# Patient Record
Sex: Female | Born: 1995 | Race: White | Hispanic: No | Marital: Single | Smoking: Never smoker
Health system: Southern US, Community
[De-identification: ages and names within clinical notes are randomized; demographics above are authoritative.]

## PROBLEM LIST (undated history)

## (undated) DIAGNOSIS — Z9889 Other specified postprocedural states: Secondary | ICD-10-CM

## (undated) DIAGNOSIS — Z23 Encounter for immunization: Secondary | ICD-10-CM

## (undated) DIAGNOSIS — R51 Headache: Secondary | ICD-10-CM

## (undated) DIAGNOSIS — N200 Calculus of kidney: Secondary | ICD-10-CM

## (undated) DIAGNOSIS — R519 Headache, unspecified: Secondary | ICD-10-CM

## (undated) DIAGNOSIS — R112 Nausea with vomiting, unspecified: Secondary | ICD-10-CM

## (undated) DIAGNOSIS — Z8041 Family history of malignant neoplasm of ovary: Secondary | ICD-10-CM

## (undated) DIAGNOSIS — Z87442 Personal history of urinary calculi: Secondary | ICD-10-CM

## (undated) DIAGNOSIS — F419 Anxiety disorder, unspecified: Secondary | ICD-10-CM

## (undated) DIAGNOSIS — T8859XA Other complications of anesthesia, initial encounter: Secondary | ICD-10-CM

## (undated) DIAGNOSIS — J40 Bronchitis, not specified as acute or chronic: Secondary | ICD-10-CM

## (undated) HISTORY — DX: Family history of malignant neoplasm of ovary: Z80.41

## (undated) HISTORY — DX: Encounter for immunization: Z23

---
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
## 2017-01-09 DIAGNOSIS — Z8614 Personal history of Methicillin resistant Staphylococcus aureus infection: Secondary | ICD-10-CM

## 2017-01-09 HISTORY — DX: Personal history of Methicillin resistant Staphylococcus aureus infection: Z86.14

## 2017-11-08 ENCOUNTER — Encounter: Payer: Self-pay | Admitting: General Surgery

## 2017-11-08 ENCOUNTER — Ambulatory Visit: Payer: BC Managed Care – PPO | Admitting: General Surgery

## 2017-11-08 VITALS — BP 110/73 | HR 121 | Temp 97.9°F | Resp 16 | Ht 65.0 in | Wt 151.0 lb

## 2017-11-08 DIAGNOSIS — L0591 Pilonidal cyst without abscess: Secondary | ICD-10-CM

## 2017-11-08 NOTE — Patient Instructions (Signed)
Please schedule pilonidal cyst excision surgery.

## 2017-11-08 NOTE — Progress Notes (Signed)
Patient ID: Grace Mendoza, female   DOB: 08/06/1995, 22 y.o.   MRN: [id number]  Chief Complaint  Patient presents with  . Follow-up    Pilonidal cyst -discuss surgery    HPI Grace Mendoza is a 22 y.o. female.  Here today for pilonidal cyst. Seen Pediatrician last month for pain redness and swelling and was prescribed antibiotic, mucopurin topical. She took the full course of medication. However she states last week she started having redness and swelling and pain again.she used the creme that was prescribed last time and it has helped. Denies fever or chills. Complains of nausea last week.  The patient has not had any episodes of spontaneous drainage.  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  History reviewed. No pertinent past medical history.  Past Surgical History:  Procedure Laterality Date  . TONSILLECTOMY  2004    History reviewed. No pertinent family history.  Social History Social History   Tobacco Use  . Smoking status: Never Smoker  . Smokeless tobacco: Never Used  Substance Use Topics  . Alcohol use: No  . Drug use: No    No Known Allergies  Current Outpatient Medications  Medication Sig Dispense Refill  . drospirenone-ethinyl estradiol (YASMIN,ZARAH,SYEDA) 3-0.03 MG tablet TAKE 1 TABLET BY MOUTH ONCE A DAY FOR 28 DAYS  6   No current facility-administered medications for this visit.     Review of Systems Review of Systems  Constitutional: Negative.   Respiratory: Negative.   Skin: Negative.     Blood pressure 110/73, pulse (!) 121, temperature 97.9 F (36.6 C), temperature source Temporal, resp. rate 16, height 5\' 5"  (1.651 m), weight 151 lb (68.5 kg), SpO2 98 %.  Physical Exam Physical Exam  Constitutional: She is oriented to person, place, and time. She appears well-developed and well-nourished.  Cardiovascular: Normal rate and regular rhythm.  Pulmonary/Chest: Effort normal and breath  sounds normal.  Musculoskeletal:       Back:  Neurological: She is alert and oriented to person, place, and time.  Skin: Skin is warm and dry.       Assessment    Recurrent inflammation associated with a pilonidal cyst, minimal active inflammation at this time.    Plan    We will arrange for his pilonidal cyst surgery likely just before Thanksgiving to minimize time off from school.       HPI, Physical Exam, Assessment and Plan have been scribed under the direction and in the presence of Earline Mayotte, MD. Arn Medal, CMA  I have completed the exam and reviewed the above documentation for accuracy and completeness.  I agree with the above.  Museum/gallery conservator has been used and any errors in dictation or transcription are unintentional.  Donnalee Curry, M.D., F.A.C.S.  Merrily Pew Tracy Kinner 11/08/2017, 8:06 PM  Patient's surgery has been scheduled for 12-05-17 at [facility] with Dr. Lemar Livings.  The patient is aware to call the office should they have further questions.   Nicholes Mango, CMA

## 2017-11-29 ENCOUNTER — Other Ambulatory Visit: Payer: Self-pay

## 2017-11-29 ENCOUNTER — Encounter: Payer: Self-pay | Admitting: *Deleted

## 2017-11-29 ENCOUNTER — Encounter
Admission: RE | Admit: 2017-11-29 | Discharge: 2017-11-29 | Disposition: A | Discharge status: Home / Self Care | Payer: BC Managed Care – PPO | Source: Ambulatory Visit | Attending: General Surgery | Admitting: General Surgery

## 2017-11-29 HISTORY — DX: Headache: R51

## 2017-11-29 HISTORY — DX: Headache, unspecified: R51.9

## 2017-11-29 HISTORY — DX: Bronchitis, not specified as acute or chronic: J40

## 2017-11-29 NOTE — Patient Instructions (Signed)
Your procedure is scheduled on: 12-05-17 Report to Same Day Surgery 2nd floor medical mall [facility](Medical Mall Entrance-take elevator on left to 2nd floor.  Check in with surgery information desk.) To find out your arrival time please call [phone] between 1PM - 3PM on 12-04-17  Remember: Instructions that are not followed completely may result in serious medical risk, up to and including death, or upon the discretion of your surgeon and anesthesiologist your surgery may need to be rescheduled.    _x___ 1. Do not eat food after midnight the night before your procedure. You may drink clear liquids up to 2 hours before you are scheduled to arrive at the hospital for your procedure.  Do not drink clear liquids within 2 hours of your scheduled arrival to the hospital.  Clear liquids include  --Water or Apple juice without pulp  --Clear carbohydrate beverage such as ClearFast or Gatorade  --Black Coffee or Clear Tea (No milk, no creamers, do not add anything to the coffee or Tea   ____Ensure clear carbohydrate drink on the way to the hospital for bariatric patients  ____Ensure clear carbohydrate drink 3 hours before surgery for Dr Rutherford NailByrnett's patients if physician instructed.   No gum chewing or hard candies.     __x__ 2. No Alcohol for 24 hours before or after surgery.   __x__3. No Smoking or e-cigarettes for 24 prior to surgery.  Do not use any chewable tobacco products for at least 6 hour prior to surgery   ____  4. Bring all medications with you on the day of surgery if instructed.    __x__ 5. Notify your doctor if there is any change in your medical condition     (cold, fever, infections).    x___6. On the morning of surgery brush your teeth with toothpaste and water.  You may rinse your mouth with mouth wash if you wish.  Do not swallow any toothpaste or mouthwash.   Do not wear jewelry, make-up, hairpins, clips or nail polish.  Do not wear lotions, powders, or perfumes. You may wear  deodorant.  Do not shave 48 hours prior to surgery. Men may shave face and neck.  Do not bring valuables to the hospital.    [facility] is not responsible for any belongings or valuables.               Contacts, dentures or bridgework may not be worn into surgery.  Leave your suitcase in the car. After surgery it may be brought to your room.  For patients admitted to the hospital, discharge time is determined by your treatment team.  _  Patients discharged the day of surgery will not be allowed to drive home.  You will need someone to drive you home and stay with you the night of your procedure.    Please read over the following fact sheets that you were given:   [facility] Preparing for [facility]   ____ Take anti-hypertensive listed below, cardiac, seizure, asthma,     anti-reflux and psychiatric medicines. These include:  1. none  2.  3.  4.  5.  6.  ____Fleets enema or Magnesium Citrate as directed.   ____ Use CHG Soap or sage wipes as directed on instruction sheet   ____ Use inhalers on the day of surgery and bring to hospital day of surgery  ____ Stop Metformin and Janumet 2 days prior to surgery.    ____ Take 1/2 of usual insulin dose the night before  surgery and none on the morning surgery.   ____ Follow recommendations from Cardiologist, Pulmonologist or PCP regarding stopping Aspirin, Coumadin, Plavix ,Eliquis, Effient, or Pradaxa, and Pletal.  ____Stop Anti-inflammatories such as Advil, Aleve, Ibuprofen, Motrin, Naproxen, Naprosyn, Goodies powders or aspirin products NOW-OK to take Tylenol    ____ Stop supplements until after surgery.     ____ Bring C-Pap to the hospital.

## 2017-12-04 MED ORDER — CEFAZOLIN SODIUM-DEXTROSE 2-4 GM/100ML-% IV SOLN
2.0000 g | INTRAVENOUS | Status: AC
  Administered 2017-12-05: 2 g via INTRAVENOUS

## 2017-12-05 ENCOUNTER — Ambulatory Visit
Admission: RE | Admit: 2017-12-05 | Discharge: 2017-12-05 | Disposition: A | Discharge status: Home / Self Care | Payer: BC Managed Care – PPO | Source: Ambulatory Visit | Attending: General Surgery | Admitting: General Surgery

## 2017-12-05 ENCOUNTER — Encounter: Payer: Self-pay | Admitting: Anesthesiology

## 2017-12-05 ENCOUNTER — Encounter
Admission: RE | Disposition: A | Discharge status: Home / Self Care | Payer: Self-pay | Source: Ambulatory Visit | Attending: General Surgery

## 2017-12-05 ENCOUNTER — Ambulatory Visit: Payer: BC Managed Care – PPO | Admitting: Anesthesiology

## 2017-12-05 DIAGNOSIS — Z793 Long term (current) use of hormonal contraceptives: Secondary | ICD-10-CM | POA: Insufficient documentation

## 2017-12-05 DIAGNOSIS — L0591 Pilonidal cyst without abscess: Secondary | ICD-10-CM | POA: Insufficient documentation

## 2017-12-05 HISTORY — PX: PILONIDAL CYST EXCISION: SHX744

## 2017-12-05 LAB — POCT PREGNANCY, URINE: Preg Test, Ur: NEGATIVE

## 2017-12-05 SURGERY — EXCISION, PILONIDAL CYST, EXTENSIVE
Anesthesia: General

## 2017-12-05 MED ORDER — HYDROCODONE-ACETAMINOPHEN 5-325 MG PO TABS: 1.0000 | ORAL_TABLET | ORAL | 0 refills | Status: DC | PRN

## 2017-12-05 MED ORDER — PROMETHAZINE HCL 25 MG/ML IJ SOLN
INTRAMUSCULAR | Status: AC
  Filled 2017-12-05: qty 1

## 2017-12-05 MED ORDER — FAMOTIDINE 20 MG PO TABS
ORAL_TABLET | ORAL | Status: AC
  Filled 2017-12-05: qty 1

## 2017-12-05 MED ORDER — METRONIDAZOLE 500 MG PO TABS: 500.0000 mg | ORAL_TABLET | Freq: Three times a day (TID) | ORAL | 0 refills | Status: AC

## 2017-12-05 MED ORDER — CEFAZOLIN SODIUM-DEXTROSE 2-4 GM/100ML-% IV SOLN
INTRAVENOUS | Status: AC
  Filled 2017-12-05: qty 100

## 2017-12-05 MED ORDER — BUPIVACAINE-EPINEPHRINE (PF) 0.5% -1:200000 IJ SOLN
INTRAMUSCULAR | Status: DC | PRN
  Administered 2017-12-05: 30 mL

## 2017-12-05 MED ORDER — ACETAMINOPHEN 10 MG/ML IV SOLN
INTRAVENOUS | Status: DC | PRN
  Administered 2017-12-05: 1000 mg via INTRAVENOUS

## 2017-12-05 MED ORDER — BUPIVACAINE HCL (PF) 0.5 % IJ SOLN
INTRAMUSCULAR | Status: AC
  Filled 2017-12-05: qty 30

## 2017-12-05 MED ORDER — MIDAZOLAM HCL 2 MG/2ML IJ SOLN
INTRAMUSCULAR | Status: DC | PRN
  Administered 2017-12-05: 2 mg via INTRAVENOUS

## 2017-12-05 MED ORDER — FENTANYL CITRATE (PF) 100 MCG/2ML IJ SOLN
INTRAMUSCULAR | Status: DC | PRN
  Administered 2017-12-05 (×2): 50 ug via INTRAVENOUS

## 2017-12-05 MED ORDER — PROPOFOL 10 MG/ML IV BOLUS
INTRAVENOUS | Status: DC | PRN
  Administered 2017-12-05: 150 mg via INTRAVENOUS

## 2017-12-05 MED ORDER — PROMETHAZINE HCL 25 MG/ML IJ SOLN: 12.5000 mg | INTRAMUSCULAR | Status: DC | PRN

## 2017-12-05 MED ORDER — HYDROCODONE-ACETAMINOPHEN 5-325 MG PO TABS
ORAL_TABLET | ORAL | Status: AC
  Filled 2017-12-05: qty 1

## 2017-12-05 MED ORDER — ONDANSETRON HCL 4 MG/2ML IJ SOLN
INTRAMUSCULAR | Status: AC
  Filled 2017-12-05: qty 2

## 2017-12-05 MED ORDER — FENTANYL CITRATE (PF) 100 MCG/2ML IJ SOLN: 25.0000 ug | INTRAMUSCULAR | Status: DC | PRN

## 2017-12-05 MED ORDER — ACETAMINOPHEN 10 MG/ML IV SOLN
INTRAVENOUS | Status: AC
  Filled 2017-12-05: qty 100

## 2017-12-05 MED ORDER — ROCURONIUM BROMIDE 100 MG/10ML IV SOLN
INTRAVENOUS | Status: DC | PRN
  Administered 2017-12-05: 30 mg via INTRAVENOUS

## 2017-12-05 MED ORDER — PROMETHAZINE HCL 25 MG/ML IJ SOLN
12.5000 mg | Freq: Once | INTRAMUSCULAR | Status: AC
  Administered 2017-12-05: 12.5 mg via INTRAVENOUS

## 2017-12-05 MED ORDER — HYDROCODONE-ACETAMINOPHEN 5-325 MG PO TABS
1.0000 | ORAL_TABLET | ORAL | Status: DC | PRN
  Administered 2017-12-05: 1 via ORAL

## 2017-12-05 MED ORDER — SUGAMMADEX SODIUM 200 MG/2ML IV SOLN
INTRAVENOUS | Status: AC
  Filled 2017-12-05: qty 2

## 2017-12-05 MED ORDER — LACTATED RINGERS IV SOLN
INTRAVENOUS | Status: DC
  Administered 2017-12-05: 12:00:00 via INTRAVENOUS

## 2017-12-05 MED ORDER — DEXAMETHASONE SODIUM PHOSPHATE 10 MG/ML IJ SOLN
INTRAMUSCULAR | Status: DC | PRN
  Administered 2017-12-05: 10 mg via INTRAVENOUS

## 2017-12-05 MED ORDER — ONDANSETRON HCL 4 MG/2ML IJ SOLN: 4.0000 mg | Freq: Once | INTRAMUSCULAR | Status: DC | PRN

## 2017-12-05 MED ORDER — DEXAMETHASONE SODIUM PHOSPHATE 10 MG/ML IJ SOLN
INTRAMUSCULAR | Status: AC
  Filled 2017-12-05: qty 1

## 2017-12-05 MED ORDER — FENTANYL CITRATE (PF) 100 MCG/2ML IJ SOLN
INTRAMUSCULAR | Status: AC
  Filled 2017-12-05: qty 2

## 2017-12-05 MED ORDER — SUGAMMADEX SODIUM 200 MG/2ML IV SOLN
INTRAVENOUS | Status: DC | PRN
  Administered 2017-12-05: 150 mg via INTRAVENOUS

## 2017-12-05 MED ORDER — ONDANSETRON HCL 4 MG/2ML IJ SOLN
INTRAMUSCULAR | Status: DC | PRN
  Administered 2017-12-05: 4 mg via INTRAVENOUS

## 2017-12-05 MED ORDER — EPINEPHRINE PF 1 MG/ML IJ SOLN
INTRAMUSCULAR | Status: AC
  Filled 2017-12-05: qty 1

## 2017-12-05 MED ORDER — MIDAZOLAM HCL 2 MG/2ML IJ SOLN
INTRAMUSCULAR | Status: AC
  Filled 2017-12-05: qty 2

## 2017-12-05 MED ORDER — LIDOCAINE HCL (CARDIAC) PF 100 MG/5ML IV SOSY
PREFILLED_SYRINGE | INTRAVENOUS | Status: DC | PRN
  Administered 2017-12-05: 100 mg via INTRAVENOUS

## 2017-12-05 MED ORDER — SODIUM CHLORIDE FLUSH 0.9 % IV SOLN
INTRAVENOUS | Status: AC
  Filled 2017-12-05: qty 10

## 2017-12-05 MED ORDER — FAMOTIDINE 20 MG PO TABS
20.0000 mg | ORAL_TABLET | Freq: Once | ORAL | Status: AC
  Administered 2017-12-05: 20 mg via ORAL

## 2017-12-05 SURGICAL SUPPLY — 35 items
BLADE SURG 11 STRL SS SAFETY (MISCELLANEOUS) ×3 IMPLANT
BLADE SURG 15 STRL SS SAFETY (BLADE) ×3 IMPLANT
CANISTER SUCT 1200ML W/VALVE (MISCELLANEOUS) ×3 IMPLANT
CLOSURE WOUND 1/2 X4 (GAUZE/BANDAGES/DRESSINGS) ×1
COVER WAND RF STERILE (DRAPES) IMPLANT
DRAPE LAPAROTOMY 100X77 ABD (DRAPES) ×3 IMPLANT
DRSG OPSITE POSTOP 3X4 (GAUZE/BANDAGES/DRESSINGS) ×3 IMPLANT
DRSG TEGADERM 4X4.75 (GAUZE/BANDAGES/DRESSINGS) IMPLANT
DRSG TELFA 3X8 NADH (GAUZE/BANDAGES/DRESSINGS) IMPLANT
ELECT CAUTERY BLADE TIP 2.5 (TIP) ×3
ELECT REM PT RETURN 9FT ADLT (ELECTROSURGICAL) ×3
ELECTRODE CAUTERY BLDE TIP 2.5 (TIP) ×1 IMPLANT
ELECTRODE REM PT RTRN 9FT ADLT (ELECTROSURGICAL) ×1 IMPLANT
GLOVE BIO SURGEON STRL SZ7.5 (GLOVE) ×6 IMPLANT
GLOVE INDICATOR 8.0 STRL GRN (GLOVE) ×6 IMPLANT
GOWN STRL REUS W/ TWL LRG LVL3 (GOWN DISPOSABLE) ×2 IMPLANT
GOWN STRL REUS W/TWL LRG LVL3 (GOWN DISPOSABLE) ×4
KIT TURNOVER KIT A (KITS) ×3 IMPLANT
LABEL OR SOLS (LABEL) ×3 IMPLANT
NEEDLE HYPO 22GX1.5 SAFETY (NEEDLE) ×3 IMPLANT
NEEDLE HYPO 25X1 1.5 SAFETY (NEEDLE) ×3 IMPLANT
NS IRRIG 500ML POUR BTL (IV SOLUTION) ×3 IMPLANT
PACK BASIN MINOR ARMC (MISCELLANEOUS) ×3 IMPLANT
SOL PREP PVP 2OZ (MISCELLANEOUS) ×3
SOLUTION PREP PVP 2OZ (MISCELLANEOUS) ×1 IMPLANT
STRIP CLOSURE SKIN 1/2X4 (GAUZE/BANDAGES/DRESSINGS) ×2 IMPLANT
SUT PROLENE 4-0 (SUTURE) ×2
SUT PROLENE 4-0 RB1 30XMFL BLU (SUTURE) ×1
SUT VIC AB 2-0 CT1 (SUTURE) ×3 IMPLANT
SUT VIC AB 2-0 CT2 27 (SUTURE) ×3 IMPLANT
SUT VICRYL 3-0 27IN (SUTURE) ×3 IMPLANT
SUT VICRYL+ 3-0 144IN (SUTURE) ×3 IMPLANT
SUTURE PROLEN 4-0 RB1 30XMFL (SUTURE) ×1 IMPLANT
SWABSTK COMLB BENZOIN TINCTURE (MISCELLANEOUS) ×3 IMPLANT
SYR 10ML LL (SYRINGE) ×3 IMPLANT

## 2017-12-05 NOTE — OR Nursing (Signed)
Per Dr. Lemar LivingsByrnett tele [phone], ok to discharge to home without his visit.

## 2017-12-05 NOTE — H&P (Signed)
No change in clinical history or exam. For pilonidal cyst excision.

## 2017-12-05 NOTE — Anesthesia Procedure Notes (Signed)
Procedure Name: Intubation Date/Time: 12/05/2017 12:16 PM Performed by: Aline Brochure, CRNA Pre-anesthesia Checklist: Patient identified, Emergency Drugs available, Suction available and Patient being monitored Patient Re-evaluated:Patient Re-evaluated prior to induction Oxygen Delivery Method: Circle system utilized Preoxygenation: Pre-oxygenation with 100% oxygen Induction Type: IV induction Ventilation: Mask ventilation without difficulty Laryngoscope Size: Mac and 3 Grade View: Grade II Tube type: Oral Tube size: 7.0 mm Number of attempts: 1 Airway Equipment and Method: Stylet Placement Confirmation: ETT inserted through vocal cords under direct vision,  positive ETCO2 and breath sounds checked- equal and bilateral Secured at: 21 cm Tube secured with: Tape Dental Injury: Teeth and Oropharynx as per pre-operative assessment

## 2017-12-05 NOTE — OR Nursing (Signed)
C/o "I feel dizzy and weak" after dressed and to wheelchair and IV d/c'd.  Assisted back to recliner, approx 10 minutes later, "I feel fine now".  VS obtained, given to Dr Karlton LemonKarenz, notified him pt taking po's well, advises ok to discharge to home.

## 2017-12-05 NOTE — Anesthesia Post-op Follow-up Note (Signed)
Anesthesia QCDR form completed.        

## 2017-12-05 NOTE — Anesthesia Preprocedure Evaluation (Signed)
Anesthesia Evaluation  Patient identified by MRN, date of birth, ID band Patient awake    Reviewed: Allergy & Precautions, H&P , NPO status , Patient's Chart, lab work & pertinent test results, reviewed documented beta blocker date and time   History of Anesthesia Complications Negative for: history of anesthetic complications  Airway Mallampati: II  TM Distance: >3 FB Neck ROM: full    Dental  (+) Dental Advidsory Given, Teeth Intact   Pulmonary neg pulmonary ROS,           Cardiovascular Exercise Tolerance: Good negative cardio ROS       Neuro/Psych  Headaches, neg Seizures negative psych ROS   GI/Hepatic negative GI ROS, Neg liver ROS,   Endo/Other  negative endocrine ROS  Renal/GU negative Renal ROS  negative genitourinary   Musculoskeletal   Abdominal   Peds  Hematology negative hematology ROS (+)   Anesthesia Other Findings Past Medical History: No date: Bronchitis No date: Headache     Comment:  migraines   Reproductive/Obstetrics negative OB ROS                             Anesthesia Physical Anesthesia Plan  ASA: I  Anesthesia Plan: General   Post-op Pain Management:    Induction: Intravenous  PONV Risk Score and Plan: 3 and Ondansetron, Dexamethasone, Midazolam, Promethazine and Treatment may vary due to age or medical condition  Airway Management Planned: Oral ETT  Additional Equipment:   Intra-op Plan:   Post-operative Plan: Extubation in OR  Informed Consent: I have reviewed the patients History and Physical, chart, labs and discussed the procedure including the risks, benefits and alternatives for the proposed anesthesia with the patient or authorized representative who has indicated his/her understanding and acceptance.   Dental Advisory Given  Plan Discussed with: Anesthesiologist, CRNA and Surgeon  Anesthesia Plan Comments:         Anesthesia  Quick Evaluation

## 2017-12-05 NOTE — Discharge Instructions (Signed)

## 2017-12-05 NOTE — Anesthesia Preprocedure Evaluation (Deleted)
Anesthesia Evaluation  Patient identified by MRN, date of birth, ID band Patient awake    Reviewed: Allergy & Precautions, NPO status , Patient's Chart, lab work & pertinent test results, reviewed documented beta blocker date and time   Airway Mallampati: II  TM Distance: >3 FB     Dental  (+) Chipped   Pulmonary           Cardiovascular      Neuro/Psych  Headaches,    GI/Hepatic   Endo/Other    Renal/GU      Musculoskeletal   Abdominal   Peds  Hematology   Anesthesia Other Findings   Reproductive/Obstetrics                             Anesthesia Physical Anesthesia Plan  ASA: II  Anesthesia Plan: General   Post-op Pain Management:    Induction: Intravenous  PONV Risk Score and Plan:   Airway Management Planned: Oral ETT  Additional Equipment:   Intra-op Plan:   Post-operative Plan:   Informed Consent: I have reviewed the patients History and Physical, chart, labs and discussed the procedure including the risks, benefits and alternatives for the proposed anesthesia with the patient or authorized representative who has indicated his/her understanding and acceptance.     Plan Discussed with: CRNA  Anesthesia Plan Comments:         Anesthesia Quick Evaluation

## 2017-12-05 NOTE — Op Note (Signed)
Preoperative diagnosis: Pilonidal cyst.  Postoperative diagnosis: Same.  Operative procedure: Excision of pilonidal cyst.  Operating Surgeon: Donnalee CurryJeffrey Ayeisha Lindenberger, MD.  Anesthesia: General endotracheal, Marcaine 0.5% with 1 to 200,000 units of epinephrine, 30 cc.  Estimated blood loss: 10 cc.  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  She was felt to be a candidate for excision.  She received Ancef prior to the procedure.  Operative note: The patient was given the opportunity for local anesthesia or general anesthesia and chose the latter.  She underwent general anesthesia was rolled prone.  The buttocks were taped apart and local anesthesia was infiltrated.  Initially 3 diamond-shaped incisions were used to remove each of the pores.  This was converted into a single 3 cm opening to provide better exposure.  Chronic inflammatory tissue was removed.  Hemostasis was electrocautery.  The adipose tissue was elevated off the gluteus fascia and interrupted 2-0 Vicryl figure-of-eight sutures used to obliterate the deep dead space.  A similar layer was used in the superficial fat.  The skin was closed with interrupted 3-0 Vicryl subcuticular sutures.  Benzoin and Steri-Strip followed by a honeycomb dressing was applied.  The patient tolerated the procedure well was taken recovery room in stable condition.

## 2017-12-05 NOTE — OR Nursing (Signed)
Little bloody sputum with cough when up to bathroom.  Dr. Karlton LemonKarenz in to see patient postop 1453.

## 2017-12-05 NOTE — Transfer of Care (Signed)
Immediate Anesthesia Transfer of Care Note  Patient: Grace Mendoza  Procedure(s) Performed: CYST EXCISION PILONIDAL EXTENSIVE (N/A )  Patient Location: PACU  Anesthesia Type:General  Level of Consciousness: sedated  Airway & Oxygen Therapy: Patient connected to face mask oxygen  Post-op Assessment: Post -op Vital signs reviewed and stable  Post vital signs: stable  Last Vitals:  Vitals Value Taken Time  BP 93/46 12/05/2017  1:03 PM  Temp 36.4 C 12/05/2017  1:03 PM  Pulse 82 12/05/2017  1:03 PM  Resp 16 12/05/2017  1:03 PM  SpO2 99 % 12/05/2017  1:03 PM  Vitals shown include unvalidated device data.  Last Pain:  Vitals:   12/05/17 1112  TempSrc: Oral  PainSc: 0-No pain         Complications: No apparent anesthesia complications

## 2017-12-10 LAB — SURGICAL PATHOLOGY

## 2017-12-10 NOTE — Anesthesia Postprocedure Evaluation (Signed)
Anesthesia Post Note  Patient: Grace SpiceLindsay Anne Parthasarathy  Procedure(s) Performed: CYST EXCISION PILONIDAL EXTENSIVE (N/A )  Patient location during evaluation: PACU Anesthesia Type: General Level of consciousness: awake and alert Pain management: pain level controlled Vital Signs Assessment: post-procedure vital signs reviewed and stable Respiratory status: spontaneous breathing, nonlabored ventilation, respiratory function stable and patient connected to nasal cannula oxygen Cardiovascular status: blood pressure returned to baseline and stable Postop Assessment: no apparent nausea or vomiting Anesthetic complications: no     Last Vitals:  Vitals:   12/05/17 1454 12/05/17 1520  BP: (!) 100/51 (!) 94/43  Pulse: 86 81  Resp: 16 16  Temp:    SpO2: 100% 99%    Last Pain:  Vitals:   12/05/17 1520  TempSrc:   PainSc: 1                  Lenard SimmerAndrew Bianney [place]wood

## 2017-12-17 ENCOUNTER — Ambulatory Visit (INDEPENDENT_AMBULATORY_CARE_PROVIDER_SITE_OTHER): Payer: BC Managed Care – PPO

## 2017-12-17 VITALS — BP 110/73 | HR 121 | Temp 97.9°F | Resp 16 | Ht 65.0 in | Wt 151.0 lb

## 2017-12-17 DIAGNOSIS — L0591 Pilonidal cyst without abscess: Secondary | ICD-10-CM

## 2017-12-17 NOTE — Progress Notes (Signed)
Patient ID: [name], female   DOB: 11/26/1995, 22 y.o.   MRN: [id number] Patient came in today for a wound check.  The wound is clean, with no signs of infection noted. Follow up as scheduled.

## 2017-12-27 ENCOUNTER — Other Ambulatory Visit: Payer: Self-pay

## 2017-12-27 ENCOUNTER — Encounter: Payer: Self-pay | Admitting: General Surgery

## 2017-12-27 ENCOUNTER — Ambulatory Visit (INDEPENDENT_AMBULATORY_CARE_PROVIDER_SITE_OTHER): Payer: BC Managed Care – PPO | Admitting: General Surgery

## 2017-12-27 VITALS — BP 127/82 | HR 112 | Resp 12 | Ht 64.0 in | Wt 147.0 lb

## 2017-12-27 DIAGNOSIS — L0591 Pilonidal cyst without abscess: Secondary | ICD-10-CM

## 2017-12-27 NOTE — Patient Instructions (Addendum)
The patient is aware to call back for any questions or new concerns. Follow up as needed or if wound does not heal or there are concerns.

## 2017-12-27 NOTE — Progress Notes (Signed)
Patient ID: Grace Mendoza, female   DOB: 06/30/1995, 22 y.o.   MRN: [id number]  Chief Complaint  Patient presents with  . Routine Post Op    HPI Grace Mendoza is a 22 y.o. female.   s/p pilonidal cyst excision done 12-05-17. She states she is doing well, no further pain medication needed. She does notice a small amount of blood occasionally.  HPI  Past Medical History:  Diagnosis Date  . Bronchitis   . Headache    migraines    Past Surgical History:  Procedure Laterality Date  . PILONIDAL CYST EXCISION N/A 12/05/2017   Procedure: CYST EXCISION PILONIDAL EXTENSIVE;  Surgeon: Earline MayotteByrnett, Shashana Fullington W, MD;  Location: [facility] OR[facility];  Service: General;  Laterality: N/A;  . TONSILLECTOMY  2004    No family history on file.  Social History Social History   Tobacco Use  . Smoking status: Never Smoker  . Smokeless tobacco: Never Used  Substance Use Topics  . Alcohol use: No  . Drug use: No    No Known Allergies  Current Outpatient Medications  Medication Sig Dispense Refill  . diphenhydrAMINE (BENADRYL) 25 MG tablet Take 25 mg by mouth every 6 (six) hours as needed.    . drospirenone-ethinyl estradiol (YASMIN,ZARAH,SYEDA) 3-0.03 MG tablet Take 1 tablet by mouth daily.   6  . SUMAtriptan (IMITREX) 5 MG/ACT nasal spray Place 1 spray into the nose every 2 (two) hours as needed for migraine.     No current facility-administered medications for this visit.     Review of Systems Review of Systems  Constitutional: Negative.   Respiratory: Negative.   Cardiovascular: Negative.   Gastrointestinal: Negative for constipation and diarrhea.    Blood pressure 127/82, pulse (!) 112, resp. rate 12, height 5\' 4"  (1.626 m), weight 147 lb (66.7 kg), last menstrual period 12/17/2017, SpO2 98 %.  Physical Exam Physical Exam Musculoskeletal:       Back:  Skin:    General: Skin is warm and dry.     Comments: Silver nitrate application  Neurological:     Mental Status: She is alert and  oriented to person, place, and time.  Psychiatric:        Mood and Affect: Mood normal.     Data Reviewed DIAGNOSIS:  A. PILONIDAL CYST; EXCISION:  - RUPTURED PILONIDAL CYST.   Assessment    Doing well post pilonidal cyst excision.    Plan    Follow up as needed or if wound does not heal or there are concerns. The patient is aware to call back for any questions or new concerns.      HPI, Physical Exam, Assessment and Plan have been scribed under the direction and in the presence of Earline MayotteJeffrey W. Rosaisela Jamroz, MD. Dorathy DaftMarsha Hatch, RN  I have completed the exam and reviewed the above documentation for accuracy and completeness.  I agree with the above.  Museum[employer]gallery [employer]agon [employer] has been used and any errors in dictation or transcription are unintentional.  Donnalee CurryJeffrey Kyrin Garn, M.D., F.A.C.S.  [name] 12/28/2017, 7:27 AM

## 2018-12-02 ENCOUNTER — Other Ambulatory Visit: Payer: Self-pay

## 2018-12-02 DIAGNOSIS — Z20822 Contact with and (suspected) exposure to covid-19: Secondary | ICD-10-CM

## 2018-12-04 LAB — NOVEL CORONAVIRUS, NAA: SARS-CoV-2, NAA: NOT DETECTED

## 2018-12-19 ENCOUNTER — Other Ambulatory Visit: Payer: Self-pay

## 2018-12-19 DIAGNOSIS — Z20822 Contact with and (suspected) exposure to covid-19: Secondary | ICD-10-CM

## 2018-12-21 LAB — NOVEL CORONAVIRUS, NAA: SARS-CoV-2, NAA: NOT DETECTED

## 2019-01-10 HISTORY — PX: WISDOM TOOTH EXTRACTION: SHX21

## 2019-01-22 ENCOUNTER — Ambulatory Visit: Payer: BC Managed Care – PPO | Attending: Internal Medicine

## 2019-01-22 DIAGNOSIS — Z20822 Contact with and (suspected) exposure to covid-19: Secondary | ICD-10-CM

## 2019-01-23 LAB — NOVEL CORONAVIRUS, NAA: SARS-CoV-2, NAA: NOT DETECTED

## 2019-02-14 ENCOUNTER — Ambulatory Visit: Payer: BC Managed Care – PPO | Admitting: Obstetrics and Gynecology

## 2019-04-11 ENCOUNTER — Ambulatory Visit: Payer: BC Managed Care – PPO | Admitting: Obstetrics and Gynecology

## 2019-04-14 ENCOUNTER — Ambulatory Visit (INDEPENDENT_AMBULATORY_CARE_PROVIDER_SITE_OTHER): Payer: BC Managed Care – PPO | Admitting: Obstetrics and Gynecology

## 2019-04-14 ENCOUNTER — Encounter: Payer: Self-pay | Admitting: Obstetrics and Gynecology

## 2019-04-14 ENCOUNTER — Other Ambulatory Visit: Payer: Self-pay

## 2019-04-14 ENCOUNTER — Other Ambulatory Visit (HOSPITAL_COMMUNITY)
Admission: RE | Admit: 2019-04-14 | Discharge: 2019-04-14 | Disposition: A | Discharge status: Home / Self Care | Payer: BC Managed Care – PPO | Source: Ambulatory Visit | Attending: Obstetrics and Gynecology | Admitting: Obstetrics and Gynecology

## 2019-04-14 VITALS — BP 110/60 | Ht 64.0 in | Wt 161.0 lb

## 2019-04-14 DIAGNOSIS — Z113 Encounter for screening for infections with a predominantly sexual mode of transmission: Secondary | ICD-10-CM | POA: Diagnosis not present

## 2019-04-14 DIAGNOSIS — Z3041 Encounter for surveillance of contraceptive pills: Secondary | ICD-10-CM | POA: Diagnosis not present

## 2019-04-14 DIAGNOSIS — Z124 Encounter for screening for malignant neoplasm of cervix: Secondary | ICD-10-CM | POA: Insufficient documentation

## 2019-04-14 DIAGNOSIS — Z0000 Encounter for general adult medical examination without abnormal findings: Secondary | ICD-10-CM

## 2019-04-14 DIAGNOSIS — Z01419 Encounter for gynecological examination (general) (routine) without abnormal findings: Secondary | ICD-10-CM | POA: Diagnosis not present

## 2019-04-14 MED ORDER — NIKKI 3-0.02 MG PO TABS: 1.0000 | ORAL_TABLET | Freq: Every day | ORAL | 11 refills | Status: DC

## 2019-04-14 NOTE — Progress Notes (Signed)
Gynecology Annual Exam  PCP: Gregary Signs, MD  Chief Complaint:  Chief Complaint  Grace Mendoza presents with  . Gynecologic Exam    no concerns    History of Present Illness: Grace Mendoza is a 24 y.o. G0P0000 presents for annual exam. The Grace Mendoza has no complaints today.   LMP: Grace Mendoza's last menstrual period was 04/11/2019 (approximate). Menarche:13 or 14 Average Interval: regular, 28 days Duration of flow: 4 days Heavy Menses: no Clots: no Intermenstrual Bleeding: no Postcoital Bleeding: no Dysmenorrhea: no  The Grace Mendoza is sexually active. She currently uses OCP (estrogen/progesterone) for contraception. She denies dyspareunia.  The Grace Mendoza does perform self breast exams.  There is no notable family history of breast or ovarian cancer in her family.  The Grace Mendoza has regular exercise: yes.    The Grace Mendoza denies current symptoms of depression.    Review of Systems: ROS  Past Medical History:  Grace Mendoza Active Problem List   Diagnosis Date Noted  . Pilonidal cyst 07/20/2016    Past Surgical History:  Past Surgical History:  Procedure Laterality Date  . PILONIDAL CYST EXCISION N/A 12/05/2017   Procedure: CYST EXCISION PILONIDAL EXTENSIVE;  Surgeon: Robert Bellow, MD;  Location: [facility] OR[facility];  Service: General;  Laterality: N/A;  . TONSILLECTOMY  2004    Gynecologic History:  Grace Mendoza's last menstrual period was 04/11/2019 (approximate). Contraception: OCP (estrogen/progesterone) Last Pap: Results were: no history   Obstetric History: G0P0000  Family History:  History reviewed. No pertinent family history.  Social History:  Social History   Socioeconomic History  . Marital status: Single    Spouse name: Not on file  . Number of children: Not on file  . Years of education: Not on file  . Highest education level: Not on file  Occupational History  . Not on file  Tobacco Use  . Smoking status: Never Smoker  . Smokeless tobacco: Never Used  Substance and  Sexual Activity  . Alcohol use: No  . Drug use: No  . Sexual activity: Yes    Birth control/protection: Pill  Other Topics Concern  . Not on file  Social History Narrative  . Not on file   Social Determinants of Health   Financial Resource Strain:   . Difficulty of Paying Living Expenses:   Food Insecurity:   . Worried About Charity fundraiser in the Last Year:   . Arboriculturist in the Last Year:   Transportation Needs:   . Film/video editor (Medical):   Marland Kitchen Lack of Transportation (Non-Medical):   Physical Activity:   . Days of Exercise per Week:   . Minutes of Exercise per Session:   Stress:   . Feeling of Stress :   Social Connections:   . Frequency of Communication with Friends and Family:   . Frequency of Social Gatherings with Friends and Family:   . Attends Religious Services:   . Active Member of Clubs or Organizations:   . Attends Archivist Meetings:   Marland Kitchen Marital Status:   Intimate Partner Violence:   . Fear of Current or Ex-Partner:   . Emotionally Abused:   Marland Kitchen Physically Abused:   . Sexually Abused:     Allergies:  No Known Allergies  Medications: Prior to Admission medications   Medication Sig Start Date End Date Taking? Authorizing Provider  diphenhydrAMINE (BENADRYL) 25 MG tablet Take 25 mg by mouth every 6 (six) hours as needed.   Yes [provider]  NIKKI 3-0.02  MG tablet Take 1 tablet by mouth daily. 01/09/19  Yes [provider]    Physical Exam Vitals: Blood pressure 110/60, height 5\' 4"  (1.626 m), weight 161 lb (73 kg), last menstrual period 04/11/2019.  General: NAD HEENT: normocephalic, anicteric Thyroid: no enlargement, no palpable nodules Pulmonary: No increased work of breathing, CTAB Cardiovascular: RRR, distal pulses 2+ Breast: Breast symmetrical, no tenderness, no palpable nodules or masses, no skin or nipple retraction present, no nipple discharge.  No axillary or supraclavicular  lymphadenopathy. Abdomen: NABS, soft, non-tender, non-distended.  Umbilicus without lesions.  No hepatomegaly, splenomegaly or masses palpable. No evidence of hernia  Genitourinary:  External: Normal external female genitalia.  Normal urethral meatus, normal Bartholin's and Skene's glands.    Vagina: Normal vaginal mucosa, no evidence of prolapse.    Cervix: Grossly normal in appearance, no bleeding  Uterus: Non-enlarged, mobile, normal contour.  No CMT  Adnexa: ovaries non-enlarged, no adnexal masses  Rectal: deferred  Lymphatic: no evidence of inguinal lymphadenopathy Extremities: no edema, erythema, or tenderness Neurologic: Grossly intact Psychiatric: mood appropriate, affect full  Female chaperone present for pelvic and breast  portions of the physical exam    Assessment: 24 y.o. G0P0000 routine annual exam  Plan: Problem List Items Addressed This Visit    None    Visit Diagnoses    Healthcare maintenance    -  Primary   Encounter for birth control pills maintenance       Relevant Medications   NIKKI 3-0.02 MG tablet   Screening examination for STD (sexually transmitted disease)       Relevant Orders   Cytology - PAP   Cervical cancer screening       Relevant Orders   Cytology - PAP      1) Gardasil Series discussed and if applicable offered to Grace Mendoza - Grace Mendoza has previously completed 3 shot series   2) STI screening  wasoffered and accepted  3)  ASCCP guidelines and rational discussed.  Grace Mendoza opts for every 3 years screening interval  4) Contraception - the Grace Mendoza is currently using  OCP (estrogen/progesterone).  She is happy with her current form of contraception and plans to continue We discussed safe sex practices to reduce her furture risk of STI's.    5) No follow-ups on file.  01-27-1994 MD [facility], [facility] 04/14/2019 8:52 AM

## 2019-04-14 NOTE — Patient Instructions (Signed)
Institute of Medicine Recommended Dietary Allowances for Calcium and Vitamin D  Age (yr) Calcium Recommended Dietary Allowance (mg/day) Vitamin D Recommended Dietary Allowance (international units/day)  9-18 1,300 600  19-50 1,000 600  51-70 1,200 600  71 and older 1,200 800  Data from Institute of Medicine. Dietary reference intakes: calcium, vitamin D. [place], [place]: National Academies Press; 2011.     Exercising to Stay Healthy To become healthy and stay healthy, it is recommended that you do moderate-intensity and vigorous-intensity exercise. You can tell that you are exercising at a moderate intensity if your heart starts beating faster and you start breathing faster but can still hold a conversation. You can tell that you are exercising at a vigorous intensity if you are breathing much harder and faster and cannot hold a conversation while exercising. Exercising regularly is important. It has many health benefits, such as:  Improving overall fitness, flexibility, and endurance.  Increasing bone density.  Helping with weight control.  Decreasing body fat.  Increasing muscle strength.  Reducing stress and tension.  Improving overall health. How often should I exercise? Choose an activity that you enjoy, and set realistic goals. Your health care provider can help you make an activity plan that works for you. Exercise regularly as told by your health care provider. This may include:  Doing strength training two times a week, such as: ? Lifting weights. ? Using resistance bands. ? Push-ups. ? Sit-ups. ? Yoga.  Doing a certain intensity of exercise for a given amount of time. Choose from these options: ? A total of 150 minutes of moderate-intensity exercise every week. ? A total of 75 minutes of vigorous-intensity exercise every week. ? A mix of moderate-intensity and vigorous-intensity exercise every week. Children, pregnant women, people who have not exercised  regularly, people who are overweight, and older adults may need to talk with a health care provider about what activities are safe to do. If you have a medical condition, be sure to talk with your health care provider before you start a new exercise program. What are some exercise ideas? Moderate-intensity exercise ideas include:  Walking 1 mile (1.6 km) in about 15 minutes.  Biking.  Hiking.  Golfing.  Dancing.  Water aerobics. Vigorous-intensity exercise ideas include:  Walking 4.5 miles (7.2 km) or more in about 1 hour.  Jogging or running 5 miles (8 km) in about 1 hour.  Biking 10 miles (16.1 km) or more in about 1 hour.  Lap swimming.  Roller-skating or in-line skating.  Cross-country skiing.  Vigorous competitive sports, such as football, basketball, and soccer.  Jumping rope.  Aerobic dancing. What are some everyday activities that can help me to get exercise?  Yard work, such as: ? Pushing a lawn mower. ? Raking and bagging leaves.  Washing your car.  Pushing a stroller.  Shoveling snow.  Gardening.  Washing windows or floors. How can I be more active in my day-to-day activities?  Use stairs instead of an elevator.  Take a walk during your lunch break.  If you drive, park your car farther away from your work or school.  If you take public transportation, get off one stop early and walk the rest of the way.  Stand up or walk around during all of your indoor phone calls.  Get up, stretch, and walk around every 30 minutes throughout the day.  Enjoy exercise with a friend. Support to continue exercising will help you keep a regular routine of activity. What guidelines can   I follow while exercising?  Before you start a new exercise program, talk with your health care provider.  Do not exercise so much that you hurt yourself, feel dizzy, or get very short of breath.  Wear comfortable clothes and wear shoes with good support.  Drink plenty of  water while you exercise to prevent dehydration or heat stroke.  Work out until your breathing and your heartbeat get faster. Where to find more information  U.S[place] Department of Health and Human Services: www.hhs.gov  Centers for Disease Control and Prevention (CDC): www.cdc.gov Summary  Exercising regularly is important. It will improve your overall fitness, flexibility, and endurance.  Regular exercise also will improve your overall health. It can help you control your weight, reduce stress, and improve your bone density.  Do not exercise so much that you hurt yourself, feel dizzy, or get very short of breath.  Before you start a new exercise program, talk with your health care provider. This information is not intended to replace advice given to you by your health care provider. Make sure you discuss any questions you have with your health care provider. Document Revised: 12/08/2016 Document Reviewed: 11/16/2016 Elsevier Patient Education  2020 Elsevier Inc.   Budget-Friendly Healthy Eating There are many ways to save money at the grocery store and continue to eat healthy. You can be successful if you:  Plan meals according to your budget.  Make a grocery list and only purchase food according to your grocery list.  Prepare food yourself. What are tips for following this plan?  Reading food labels  Compare food labels between brand name foods and the store brand. Often the nutritional value is the same, but the store brand is lower cost.  Look for products that do not have added sugar, fat, or salt (sodium). These often cost the same but are healthier for you. Products may be labeled as: ? Sugar-free. ? Nonfat. ? Low-fat. ? Sodium-free. ? Low-sodium.  Look for lean ground beef labeled as at least 92% lean and 8% fat. Shopping  Buy only the items on your grocery list and go only to the areas of the store that have the items on your list.  Use coupons only for foods  and brands you normally buy. Avoid buying items you wouldn't normally buy simply because they are on sale.  Check online and in newspapers for weekly deals.  Buy healthy items from the bulk bins when available, such as herbs, spices, flour, pasta, nuts, and dried fruit.  Buy fruits and vegetables that are in season. Prices are usually lower on in-season produce.  Look at the unit price on the price tag. Use it to compare different brands and sizes to find out which item is the best deal.  Choose healthy items that are often low-cost, such as carrots, potatoes, apples, bananas, and oranges. Dried or canned beans are a low-cost protein source.  Buy in bulk and freeze extra food. Items you can buy in bulk include meats, fish, poultry, frozen fruits, and frozen vegetables.  Avoid buying "ready-to-eat" foods, such as pre-cut fruits and vegetables and pre-made salads.  If possible, shop around to discover where you can find the best prices. Consider other retailers such as dollar stores, larger wholesale stores, local fruit and vegetable stands, and farmers markets.  Do not shop when you are hungry. If you shop while hungry, it may be hard to stick to your list and budget.  Resist impulse buying. Use your grocery list as   your official plan for the week.  Buy a variety of vegetables and fruits by purchasing fresh, frozen, and canned items.  Look at the top and bottom shelves for deals. Foods at eye level (eye level of an adult or child) are usually more expensive.  Be efficient with your time when shopping. The more time you spend at the store, the more money you are likely to spend.  To save money when choosing more expensive foods like meats and dairy: ? Choose cheaper cuts of meat, such as bone-in chicken thighs and drumsticks instead of skinless and boneless chicken. When you are ready to prepare the chicken, you can remove the skin yourself to make it healthier. ? Choose lean meats like  chicken or turkey instead of beef. ? Choose canned seafood, such as tuna, salmon, or sardines. ? Buy eggs as a low-cost source of protein. ? Buy dried beans and peas, such as lentils, split peas, or kidney beans instead of meats. Dried beans and peas are a good alternative source of protein. ? Buy the larger tubs of yogurt instead of individual-sized containers.  Choose water instead of sodas and other sweetened beverages.  Avoid buying chips, cookies, and other "junk food." These items are usually expensive and not healthy. Cooking  Make extra food and freeze the extras in meal-sized containers or in individual portions for fast meals and snacks.  Pre-cook on days when you have extra time to prepare meals in advance. You can keep these meals in the fridge or freezer and reheat for a quick meal.  When you come home from the grocery store, wash, peel, and cut fruits and vegetables so they are ready to use and eat. This will help reduce food waste. Meal planning  Do not eat out or get fast food. Prepare food at home.  Make a grocery list and make sure to bring it with you to the store. If you have a smart phone, you could use your phone to create your shopping list.  Plan meals and snacks according to a grocery list and budget you create.  Use leftovers in your meal plan for the week.  Look for recipes where you can cook once and make enough food for two meals.  Include budget-friendly meals like stews, casseroles, and stir-fry dishes.  Try some meatless meals or try "no cook" meals like salads.  Make sure that half your plate is filled with fruits or vegetables. Choose from fresh, frozen, or canned fruits and vegetables. If eating canned, remember to rinse them before eating. This will remove any excess salt added for packaging. Summary  Eating healthy on a budget is possible if you plan your meals according to your budget, purchase according to your budget and grocery list, and  prepare food yourself.  Tips for buying more food on a limited budget include buying generic brands, using coupons only for foods you normally buy, and buying healthy items from the bulk bins when available.  Tips for buying cheaper food to replace expensive food include choosing cheaper, lean cuts of meat, and buying dried beans and peas. This information is not intended to replace advice given to you by your health care provider. Make sure you discuss any questions you have with your health care provider. Document Revised: 12/27/2016 Document Reviewed: 12/27/2016 Elsevier Patient Education  2020 Elsevier Inc.   Bone Health Bones protect organs, store calcium, anchor muscles, and support the whole body. Keeping your bones strong is important, especially as you   get older. You can take actions to help keep your bones strong and healthy. Why is keeping my bones healthy important?  Keeping your bones healthy is important because your body constantly replaces bone cells. Cells get old, and new cells take their place. As we age, we lose bone cells because the body may not be able to make enough new cells to replace the old cells. The amount of bone cells and bone tissue you have is referred to as bone mass. The higher your bone mass, the stronger your bones. The aging process leads to an overall loss of bone mass in the body, which can increase the likelihood of:  Joint pain and stiffness.  Broken bones.  A condition in which the bones become weak and brittle (osteoporosis). A large decline in bone mass occurs in older adults. In women, it occurs about the time of menopause. What actions can I take to keep my bones healthy? Good health habits are important for maintaining healthy bones. This includes eating nutritious foods and exercising regularly. To have healthy bones, you need to get enough of the right minerals and vitamins. Most nutrition experts recommend getting these nutrients from the  foods that you eat. In some cases, taking supplements may also be recommended. Doing certain types of exercise is also important for bone health. What are the nutritional recommendations for healthy bones?  Eating a well-balanced diet with plenty of calcium and vitamin D will help to protect your bones. Nutritional recommendations vary from person to person. Ask your health care provider what is healthy for you. Here are some general guidelines. Get enough calcium Calcium is the most important (essential) mineral for bone health. Most people can get enough calcium from their diet, but supplements may be recommended for people who are at risk for osteoporosis. Good sources of calcium include:  Dairy products, such as low-fat or nonfat milk, cheese, and yogurt.  Dark green leafy vegetables, such as bok choy and broccoli.  Calcium-fortified foods, such as orange juice, cereal, bread, soy beverages, and tofu products.  Nuts, such as almonds. Follow these recommended amounts for daily calcium intake:  Children, age 1-3: 700 mg.  Children, age 4-8: 1,000 mg.  Children, age 9-13: 1,300 mg.  Teens, age 14-18: 1,300 mg.  Adults, age 19-50: 1,000 mg.  Adults, age 51-70: ? Men: 1,000 mg. ? Women: 1,200 mg.  Adults, age 71 or older: 1,200 mg.  Pregnant and breastfeeding females: ? Teens: 1,300 mg. ? Adults: 1,000 mg. Get enough vitamin D Vitamin D is the most essential vitamin for bone health. It helps the body absorb calcium. Sunlight stimulates the skin to make vitamin D, so be sure to get enough sunlight. If you live in a cold climate or you do not get outside often, your health care provider may recommend that you take vitamin D supplements. Good sources of vitamin D in your diet include:  Egg yolks.  Saltwater fish.  Milk and cereal fortified with vitamin D. Follow these recommended amounts for daily vitamin D intake:  Children and teens, age 1-18: 600 international  units.  Adults, age 50 or younger: 400-800 international units.  Adults, age 51 or older: 800-1,000 international units. Get other important nutrients Other nutrients that are important for bone health include:  Phosphorus. This mineral is found in meat, poultry, dairy foods, nuts, and legumes. The recommended daily intake for adult men and adult women is 700 mg.  Magnesium. This mineral is found in seeds, nuts, dark   green vegetables, and legumes. The recommended daily intake for adult men is 400-420 mg. For adult women, it is 310-320 mg.  Vitamin K. This vitamin is found in green leafy vegetables. The recommended daily intake is 120 mg for adult men and 90 mg for adult women. What type of physical activity is best for building and maintaining healthy bones? Weight-bearing and strength-building activities are important for building and maintaining healthy bones. Weight-bearing activities cause muscles and bones to work against gravity. Strength-building activities increase the strength of the muscles that support bones. Weight-bearing and muscle-building activities include:  Walking and hiking.  Jogging and running.  Dancing.  Gym exercises.  Lifting weights.  Tennis and racquetball.  Climbing stairs.  Aerobics. Adults should get at least 30 minutes of moderate physical activity on most days. Children should get at least 60 minutes of moderate physical activity on most days. Ask your health care provider what type of exercise is best for you. How can I find out if my bone mass is low? Bone mass can be measured with an X-ray test called a bone mineral density (BMD) test. This test is recommended for all women who are age 65 or older. It may also be recommended for:  Men who are age 70 or older.  People who are at risk for osteoporosis because of: ? Having bones that break easily. ? Having a [place]-term disease that weakens bones, such as kidney disease or rheumatoid  arthritis. ? Having menopause earlier than normal. ? Taking medicine that weakens bones, such as steroids, thyroid hormones, or hormone treatment for breast cancer or prostate cancer. ? Smoking. ? Drinking three or more alcoholic drinks a day. If you find that you have a low bone mass, you may be able to prevent osteoporosis or further bone loss by changing your diet and lifestyle. Where can I find more information? For more information, check out the following websites:  National Osteoporosis Foundation: www.nof.org/patients  National Institutes of Health: www.bones.nih.gov  International Osteoporosis Foundation: www.iofbonehealth.org Summary  The aging process leads to an overall loss of bone mass in the body, which can increase the likelihood of broken bones and osteoporosis.  Eating a well-balanced diet with plenty of calcium and vitamin D will help to protect your bones.  Weight-bearing and strength-building activities are also important for building and maintaining strong bones.  Bone mass can be measured with an X-ray test called a bone mineral density (BMD) test. This information is not intended to replace advice given to you by your health care provider. Make sure you discuss any questions you have with your health care provider. Document Revised: 01/22/2017 Document Reviewed: 01/22/2017 Elsevier Patient Education  2020 Elsevier Inc.   

## 2019-04-17 LAB — CYTOLOGY - PAP
Chlamydia: NEGATIVE
Comment: NEGATIVE
Comment: NEGATIVE
Comment: NORMAL
Diagnosis: NEGATIVE
Neisseria Gonorrhea: NEGATIVE
Trichomonas: NEGATIVE

## 2020-03-12 ENCOUNTER — Other Ambulatory Visit: Payer: Self-pay | Admitting: Obstetrics and Gynecology

## 2020-03-12 DIAGNOSIS — Z3041 Encounter for surveillance of contraceptive pills: Secondary | ICD-10-CM

## 2020-04-30 ENCOUNTER — Encounter: Payer: Self-pay | Admitting: Obstetrics and Gynecology

## 2020-04-30 ENCOUNTER — Other Ambulatory Visit: Payer: Self-pay

## 2020-04-30 ENCOUNTER — Ambulatory Visit (INDEPENDENT_AMBULATORY_CARE_PROVIDER_SITE_OTHER): Payer: BC Managed Care – PPO | Admitting: Obstetrics and Gynecology

## 2020-04-30 VITALS — BP 128/72 | Ht 64.0 in | Wt 175.2 lb

## 2020-04-30 DIAGNOSIS — Z0000 Encounter for general adult medical examination without abnormal findings: Secondary | ICD-10-CM | POA: Diagnosis not present

## 2020-04-30 DIAGNOSIS — Z01419 Encounter for gynecological examination (general) (routine) without abnormal findings: Secondary | ICD-10-CM

## 2020-04-30 DIAGNOSIS — Z3041 Encounter for surveillance of contraceptive pills: Secondary | ICD-10-CM

## 2020-04-30 DIAGNOSIS — Z1239 Encounter for other screening for malignant neoplasm of breast: Secondary | ICD-10-CM

## 2020-04-30 MED ORDER — NIKKI 3-0.02 MG PO TABS: 1.0000 | ORAL_TABLET | Freq: Every day | ORAL | 3 refills | Status: DC

## 2020-04-30 NOTE — Progress Notes (Signed)
Gynecology Annual Exam  PCP: Erick Colace, MD  Chief Complaint:  Chief Complaint  Patient presents with  . Gynecologic Exam    History of Present Illness: Patient is a 25 y.o. G0P0000 presents for annual exam. The patient has no complaints today.   LMP: Patient's last menstrual period was 04/05/2020. Average Interval: monthly Duration of flow: 3 days Heavy Menses: Day 2 is heavy, but managable Dysmenorrhea: no  She denies passage of large clots She denies sensations of gushing or flooding of blood. She denies accidents where she bleeds through her clothing. She denies that she changes a saturated pad or tampon more frequently than every hour.  She denies that pain from her periods limits her activities.  The patient does perform self breast exams.  There is notable family history of breast or ovarian cancer in her family.- Paternal Aunt diagnosed with Ovarian cacner  The patient reports her exercise generally consists of walking 1-2 days a week .  The patient denies current symptoms of depression.   Review of Systems: ROS  Past Medical History:  Past Medical History:  Diagnosis Date  . Bronchitis   . Headache    migraines    Past Surgical History:  Past Surgical History:  Procedure Laterality Date  . PILONIDAL CYST EXCISION N/A 12/05/2017   Procedure: CYST EXCISION PILONIDAL EXTENSIVE;  Surgeon: Earline Mayotte, MD;  Location: [facility] ORS;  Service: General;  Laterality: N/A;  . TONSILLECTOMY  2004    Gynecologic History:  Patient's last menstrual period was 04/05/2020. Menarche: 13  History of fibroids, polyps, or ovarian cysts? : no  History of PCOS? no Hstory of Endometriosis? no History of abnormal pap smears? no Have you had any sexually transmitted infections in the past? no  She reports HPV vaccination in the past.   Last Pap: Results were: 2021 NIL    She identifies as a female. She is sexually active with men.   She denies dyspareunia.  She denies postcoital bleeding.  She currently uses OCP (estrogen/progesterone) for contraception.    Obstetric History: G0P0000  Family History:  History reviewed. No pertinent family history.  Social History:  Social History   Socioeconomic History  . Marital status: Single    Spouse name: Not on file  . Number of children: Not on file  . Years of education: Not on file  . Highest education level: Not on file  Occupational History  . Not on file  Tobacco Use  . Smoking status: Never Smoker  . Smokeless tobacco: Never Used  Vaping Use  . Vaping Use: Never used  Substance and Sexual Activity  . Alcohol use: No  . Drug use: No  . Sexual activity: Yes    Birth control/protection: Pill  Other Topics Concern  . Not on file  Social History Narrative  . Not on file   Social Determinants of Health   Financial Resource Strain: Not on file  Food Insecurity: Not on file  Transportation Needs: Not on file  Physical Activity: Not on file  Stress: Not on file  Social Connections: Not on file  Intimate Partner Violence: Not on file    Allergies:  No Known Allergies  Medications: Prior to Admission medications   Medication Sig Start Date End Date Taking? Authorizing Provider  diphenhydrAMINE (BENADRYL) 25 MG tablet Take 25 mg by mouth every 6 (six) hours as needed.   Yes [provider]  NIKKI 3-0.02 MG tablet TAKE 1 TABLET BY MOUTH EVERY DAY  03/12/20  Yes Nashayla Telleria, Jaquelyn Bitter, MD    Physical Exam Vitals: Blood pressure 128/72, height 5\' 4"  (1.626 m), weight 175 lb 3.2 oz (79.5 kg), last menstrual period 04/05/2020.  Physical Exam Constitutional:      Appearance: She is well-developed.  Genitourinary:     Genitourinary Comments: External: Normal appearing vulva. No lesions noted.  Bimanual examination: Uterus midline, non-tender, normal in size, shape and contour.  No CMT. No adnexal masses. No adnexal tenderness. Pelvis not fixed.  Breast Exam: breast equal  without skin changes, nipple discharge, breast lump or enlarged lymph nodes   HENT:     Head: Normocephalic and atraumatic.  Neck:     Thyroid: No thyromegaly.  Cardiovascular:     Rate and Rhythm: Normal rate and regular rhythm.     Heart sounds: Normal heart sounds.  Pulmonary:     Effort: Pulmonary effort is normal.     Breath sounds: Normal breath sounds.  Abdominal:     General: Bowel sounds are normal. There is no distension.     Palpations: Abdomen is soft. There is no mass.  Musculoskeletal:     Cervical back: Neck supple.  Neurological:     Mental Status: She is alert and oriented to person, place, and time.  Skin:    General: Skin is warm and dry.  Psychiatric:        Behavior: Behavior normal.        Thought Content: Thought content normal.        Judgment: Judgment normal.  Vitals reviewed.      Female chaperone present for pelvic and breast  portions of the physical exam  Assessment: 25 y.o. G0P0000 routine annual exam  Plan: Problem List Items Addressed This Visit   None   Visit Diagnoses    Encounter for annual routine gynecological examination    -  Primary   Health maintenance examination       Encounter for birth control pills maintenance       Relevant Medications   NIKKI 3-0.02 MG tablet   Encounter for gynecological examination without abnormal finding       Encounter for screening breast examination          1) STI screening was offered and declined  2) ASCCP guidelines and rational discussed.  Patient opts for every 3 years screening interval  3) Contraception - continue OCP  4) Routine healthcare maintenance including cholesterol, diabetes screening discussed managed by PCP   01-[birth year]1996 MD, Adel[facility], [facility] 04/30/2020 4:28 PM

## 2020-04-30 NOTE — Patient Instructions (Signed)
Institute of Medicine Recommended Dietary Allowances for Calcium and Vitamin D  Age (yr) Calcium Recommended Dietary Allowance (mg/day) Vitamin D Recommended Dietary Allowance (international units/day)  9-18 1,300 600  19-50 1,000 600  51-70 1,200 600  71 and older 1,200 800  Data from Institute of Medicine. Dietary reference intakes: calcium, vitamin D. [place], [place]: National Academies Press; 2011.    Exercising to Stay Healthy To become healthy and stay healthy, it is recommended that you do moderate-intensity and vigorous-intensity exercise. You can tell that you are exercising at a moderate intensity if your heart starts beating faster and you start breathing faster but can still hold a conversation. You can tell that you are exercising at a vigorous intensity if you are breathing much harder and faster and cannot hold a conversation while exercising. Exercising regularly is important. It has many health benefits, such as:  Improving overall fitness, flexibility, and endurance.  Increasing bone density.  Helping with weight control.  Decreasing body fat.  Increasing muscle strength.  Reducing stress and tension.  Improving overall health. How often should I exercise? Choose an activity that you enjoy, and set realistic goals. Your health care provider can help you make an activity plan that works for you. Exercise regularly as told by your health care provider. This may include:  Doing strength training two times a week, such as: ? Lifting weights. ? Using resistance bands. ? Push-ups. ? Sit-ups. ? Yoga.  Doing a certain intensity of exercise for a given amount of time. Choose from these options: ? A total of 150 minutes of moderate-intensity exercise every week. ? A total of 75 minutes of vigorous-intensity exercise every week. ? A mix of moderate-intensity and vigorous-intensity exercise every week. Children, pregnant women, people who have not exercised  regularly, people who are overweight, and older adults may need to talk with a health care provider about what activities are safe to do. If you have a medical condition, be sure to talk with your health care provider before you start a new exercise program. What are some exercise ideas? Moderate-intensity exercise ideas include:  Walking 1 mile (1.6 km) in about 15 minutes.  Biking.  Hiking.  Golfing.  Dancing.  Water aerobics. Vigorous-intensity exercise ideas include:  Walking 4.5 miles (7.2 km) or more in about 1 hour.  Jogging or running 5 miles (8 km) in about 1 hour.  Biking 10 miles (16.1 km) or more in about 1 hour.  Lap swimming.  Roller-skating or in-line skating.  Cross-country skiing.  Vigorous competitive sports, such as football, basketball, and soccer.  Jumping rope.  Aerobic dancing.   What are some everyday activities that can help me to get exercise?  Yard work, such as: ? Pushing a lawn mower. ? Raking and bagging leaves.  Washing your car.  Pushing a stroller.  Shoveling snow.  Gardening.  Washing windows or floors. How can I be more active in my day-to-day activities?  Use stairs instead of an elevator.  Take a walk during your lunch break.  If you drive, park your car farther away from your work or school.  If you take public transportation, get off one stop early and walk the rest of the way.  Stand up or walk around during all of your indoor phone calls.  Get up, stretch, and walk around every 30 minutes throughout the day.  Enjoy exercise with a friend. Support to continue exercising will help you keep a regular routine of activity. What guidelines   can I follow while exercising?  Before you start a new exercise program, talk with your health care provider.  Do not exercise so much that you hurt yourself, feel dizzy, or get very short of breath.  Wear comfortable clothes and wear shoes with good support.  Drink plenty of  water while you exercise to prevent dehydration or heat stroke.  Work out until your breathing and your heartbeat get faster. Where to find more information  U.S[place] Department of Health and Human Services: www.hhs.gov  Centers for Disease Control and Prevention (CDC): www.cdc.gov Summary  Exercising regularly is important. It will improve your overall fitness, flexibility, and endurance.  Regular exercise also will improve your overall health. It can help you control your weight, reduce stress, and improve your bone density.  Do not exercise so much that you hurt yourself, feel dizzy, or get very short of breath.  Before you start a new exercise program, talk with your health care provider. This information is not intended to replace advice given to you by your health care provider. Make sure you discuss any questions you have with your health care provider. Document Revised: 12/08/2016 Document Reviewed: 11/16/2016 Elsevier Patient Education  2021 Elsevier Inc.   Budget-Friendly Healthy Eating There are many ways to save money at the grocery store and continue to eat healthy. You can be successful if you:  Plan meals according to your budget.  Make a grocery list and only purchase food according to your grocery list.  Prepare food yourself at home. What are tips for following this plan? Reading food labels  Compare food labels between brand name foods and the store brand. Often the nutritional value is the same, but the store brand is lower cost.  Look for products that do not have added sugar, fat, or salt (sodium). These often cost the same but are healthier for you. Products may be labeled as: ? Sugar-free. ? Nonfat. ? Low-fat. ? Sodium-free. ? Low-sodium.  Look for lean ground beef labeled as at least 92% lean and 8% fat. Shopping  Buy only the items on your grocery list and go only to the areas of the store that have the items on your list.  Use coupons only for  foods and brands you normally buy. Avoid buying items you wouldn't normally buy simply because they are on sale.  Check online and in newspapers for weekly deals.  Buy healthy items from the bulk bins when available, such as herbs, spices, flour, pasta, nuts, and dried fruit.  Buy fruits and vegetables that are in season. Prices are usually lower on in-season produce.  Look at the unit price on the price tag. Use it to compare different brands and sizes to find out which item is the best deal.  Choose healthy items that are often low-cost, such as carrots, potatoes, apples, bananas, and oranges. Dried or canned beans are a low-cost protein source.  Buy in bulk and freeze extra food. Items you can buy in bulk include meats, fish, poultry, frozen fruits, and frozen vegetables.  Avoid buying "ready-to-eat" foods, such as pre-cut fruits and vegetables and pre-made salads.  If possible, shop around to discover where you can find the best prices. Consider other retailers such as dollar stores, larger wholesale stores, local fruit and vegetable stands, and farmers markets.  Do not shop when you are hungry. If you shop while hungry, it may be hard to stick to your list and budget.  Resist impulse buying. Use your grocery   list as your official plan for the week.  Buy a variety of vegetables and fruits by purchasing fresh, frozen, and canned items.  Look at the top and bottom shelves for deals. Foods at eye level (eye level of an adult or child) are usually more expensive.  Be efficient with your time when shopping. The more time you spend at the store, the more money you are likely to spend.  To save money when choosing more expensive foods like meats and dairy: ? Choose cheaper cuts of meat, such as bone-in chicken thighs and drumsticks instead of skinless and boneless chicken. When you are ready to prepare the chicken, you can remove the skin yourself to make it healthier. ? Choose lean meats  like chicken or turkey instead of beef. ? Choose canned seafood, such as tuna, salmon, or sardines. ? Buy eggs as a low-cost source of protein. ? Buy dried beans and peas, such as lentils, split peas, or kidney beans instead of meats. Dried beans and peas are a good alternative source of protein. ? Buy the larger tubs of yogurt instead of individual-sized containers.  Choose water instead of sodas and other sweetened beverages.  Avoid buying chips, cookies, and other "junk food." These items are usually expensive and not healthy.   Cooking  Make extra food and freeze the extras in meal-sized containers or in individual portions for fast meals and snacks.  Pre-cook on days when you have extra time to prepare meals in advance. You can keep these meals in the fridge or freezer and reheat for a quick meal.  When you come home from the grocery store, wash, peel, and cut fruits and vegetables so they are ready to use and eat. This will help reduce food waste. Meal planning  Do not eat out or get fast food. Prepare food at home.  Make a grocery list and make sure to bring it with you to the store. If you have a smart phone, you could use your phone to create your shopping list.  Plan meals and snacks according to a grocery list and budget you create.  Use leftovers in your meal plan for the week.  Look for recipes where you can cook once and make enough food for two meals.  Prepare budget-friendly types of meals like stews, casseroles, and stir-fry dishes.  Try some meatless meals or try "no cook" meals like salads.  Make sure that half your plate is filled with fruits or vegetables. Choose from fresh, frozen, or canned fruits and vegetables. If eating canned, remember to rinse them before eating. This will remove any excess salt added for packaging. Summary  Eating healthy on a budget is possible if you plan your meals according to your budget, purchase according to your budget and  grocery list, and prepare food yourself.  Tips for buying more food on a limited budget include buying generic brands, using coupons only for foods you normally buy, and buying healthy items from the bulk bins when available.  Tips for buying cheaper food to replace expensive food include choosing cheaper, lean cuts of meat, and buying dried beans and peas. This information is not intended to replace advice given to you by your health care provider. Make sure you discuss any questions you have with your health care provider. Document Revised: 10/09/2019 Document Reviewed: 10/09/2019 Elsevier Patient Education  2021 Elsevier Inc.   Bone Health Bones protect organs, store calcium, anchor muscles, and support the whole body. Keeping your bones   strong is important, especially as you get older. You can take actions to help keep your bones strong and healthy. Why is keeping my bones healthy important? Keeping your bones healthy is important because your body constantly replaces bone cells. Cells get old, and new cells take their place. As we age, we lose bone cells because the body may not be able to make enough new cells to replace the old cells. The amount of bone cells and bone tissue you have is referred to as bone mass. The higher your bone mass, the stronger your bones. The aging process leads to an overall loss of bone mass in the body, which can increase the likelihood of:  Joint pain and stiffness.  Broken bones.  A condition in which the bones become weak and brittle (osteoporosis). A large decline in bone mass occurs in older adults. In women, it occurs about the time of menopause.   What actions can I take to keep my bones healthy? Good health habits are important for maintaining healthy bones. This includes eating nutritious foods and exercising regularly. To have healthy bones, you need to get enough of the right minerals and vitamins. Most nutrition experts recommend getting these  nutrients from the foods that you eat. In some cases, taking supplements may also be recommended. Doing certain types of exercise is also important for bone health. What are the nutritional recommendations for healthy bones? Eating a well-balanced diet with plenty of calcium and vitamin D will help to protect your bones. Nutritional recommendations vary from person to person. Ask your health care provider what is healthy for you. Here are some general guidelines. Get enough calcium Calcium is the most important (essential) mineral for bone health. Most people can get enough calcium from their diet, but supplements may be recommended for people who are at risk for osteoporosis. Good sources of calcium include:  Dairy products, such as low-fat or nonfat milk, cheese, and yogurt.  Dark green leafy vegetables, such as bok choy and broccoli.  Calcium-fortified foods, such as orange juice, cereal, bread, soy beverages, and tofu products.  Nuts, such as almonds. Follow these recommended amounts for daily calcium intake:  Children, age 1-3: 700 mg.  Children, age 4-8: 1,000 mg.  Children, age 9-13: 1,300 mg.  Teens, age 14-18: 1,300 mg.  Adults, age 19-50: 1,000 mg.  Adults, age 51-70: ? Men: 1,000 mg. ? Women: 1,200 mg.  Adults, age 71 or older: 1,200 mg.  Pregnant and breastfeeding females: ? Teens: 1,300 mg. ? Adults: 1,000 mg. Get enough vitamin D Vitamin D is the most essential vitamin for bone health. It helps the body absorb calcium. Sunlight stimulates the skin to make vitamin D, so be sure to get enough sunlight. If you live in a cold climate or you do not get outside often, your health care provider may recommend that you take vitamin D supplements. Good sources of vitamin D in your diet include:  Egg yolks.  Saltwater fish.  Milk and cereal fortified with vitamin D. Follow these recommended amounts for daily vitamin D intake:  Children and teens, age 1-18: 600  international units.  Adults, age 50 or younger: 400-800 international units.  Adults, age 51 or older: 800-1,000 international units. Get other important nutrients Other nutrients that are important for bone health include:  Phosphorus. This mineral is found in meat, poultry, dairy foods, nuts, and legumes. The recommended daily intake for adult men and adult women is 700 mg.  Magnesium. This mineral   is found in seeds, nuts, dark green vegetables, and legumes. The recommended daily intake for adult men is 400-420 mg. For adult women, it is 310-320 mg.  Vitamin K. This vitamin is found in green leafy vegetables. The recommended daily intake is 120 mg for adult men and 90 mg for adult women.   What type of physical activity is best for building and maintaining healthy bones? Weight-bearing and strength-building activities are important for building and maintaining healthy bones. Weight-bearing activities cause muscles and bones to work against gravity. Strength-building activities increase the strength of the muscles that support bones. Weight-bearing and muscle-building activities include:  Walking and hiking.  Jogging and running.  Dancing.  Gym exercises.  Lifting weights.  Tennis and racquetball.  Climbing stairs.  Aerobics. Adults should get at least 30 minutes of moderate physical activity on most days. Children should get at least 60 minutes of moderate physical activity on most days. Ask your health care provider what type of exercise is best for you.   How can I find out if my bone mass is low? Bone mass can be measured with an X-ray test called a bone mineral density (BMD) test. This test is recommended for all women who are age 65 or older. It may also be recommended for:  Men who are age 70 or older.  People who are at risk for osteoporosis because of: ? Having bones that break easily. ? Having a Nester-term disease that weakens bones, such as kidney disease or  rheumatoid arthritis. ? Having menopause earlier than normal. ? Taking medicine that weakens bones, such as steroids, thyroid hormones, or hormone treatment for breast cancer or prostate cancer. ? Smoking. ? Drinking three or more alcoholic drinks a day. If you find that you have a low bone mass, you may be able to prevent osteoporosis or further bone loss by changing your diet and lifestyle. Where can I find more information? For more information, check out the following websites:  National Osteoporosis Foundation: www.nof.org/patients  National Institutes of Health: www.bones.nih.gov  International Osteoporosis Foundation: www.iofbonehealth.org Summary  The aging process leads to an overall loss of bone mass in the body, which can increase the likelihood of broken bones and osteoporosis.  Eating a well-balanced diet with plenty of calcium and vitamin D will help to protect your bones.  Weight-bearing and strength-building activities are also important for building and maintaining strong bones.  Bone mass can be measured with an X-ray test called a bone mineral density (BMD) test. This information is not intended to replace advice given to you by your health care provider. Make sure you discuss any questions you have with your health care provider. Document Revised: 01/22/2017 Document Reviewed: 01/22/2017 Elsevier Patient Education  2021 Elsevier Inc.   

## 2020-05-17 ENCOUNTER — Encounter: Payer: Self-pay | Admitting: Obstetrics and Gynecology

## 2020-06-04 ENCOUNTER — Emergency Department
Admission: EM | Admit: 2020-06-04 | Discharge: 2020-06-04 | Disposition: A | Discharge status: Home / Self Care | Payer: BC Managed Care – PPO | Source: Home / Self Care | Attending: Emergency Medicine | Admitting: Emergency Medicine

## 2020-06-04 ENCOUNTER — Other Ambulatory Visit: Payer: Self-pay

## 2020-06-04 ENCOUNTER — Emergency Department: Payer: BC Managed Care – PPO

## 2020-06-04 DIAGNOSIS — R109 Unspecified abdominal pain: Secondary | ICD-10-CM

## 2020-06-04 DIAGNOSIS — N132 Hydronephrosis with renal and ureteral calculous obstruction: Secondary | ICD-10-CM | POA: Insufficient documentation

## 2020-06-04 DIAGNOSIS — N10 Acute tubulo-interstitial nephritis: Secondary | ICD-10-CM | POA: Diagnosis not present

## 2020-06-04 DIAGNOSIS — N200 Calculus of kidney: Secondary | ICD-10-CM

## 2020-06-04 LAB — URINALYSIS, COMPLETE (UACMP) WITH MICROSCOPIC
Bilirubin Urine: NEGATIVE
Glucose, UA: NEGATIVE mg/dL
Ketones, ur: NEGATIVE mg/dL
Nitrite: NEGATIVE
Protein, ur: NEGATIVE mg/dL
RBC / HPF: >50 RBC/hpf — ABNORMAL HIGH (ref 0–5)
Specific Gravity, Urine: 1.015 (ref 1.005–1.030)
pH: 6 (ref 5.0–8.0)

## 2020-06-04 LAB — BASIC METABOLIC PANEL
Anion gap: 10 (ref 5–15)
BUN: 13 mg/dL (ref 6–20)
CO2: 23 mmol/L (ref 22–32)
Calcium: 8.8 mg/dL — ABNORMAL LOW (ref 8.9–10.3)
Chloride: 105 mmol/L (ref 98–111)
Creatinine, Ser: 0.99 mg/dL (ref 0.44–1.00)
GFR, Estimated: >60 mL/min (ref >60)
Glucose, Bld: 135 mg/dL — ABNORMAL HIGH (ref 70–99)
Potassium: 4 mmol/L (ref 3.5–5.1)
Sodium: 138 mmol/L (ref 135–145)

## 2020-06-04 LAB — CBC
HCT: 41.2 % (ref 36.0–46.0)
Hemoglobin: 13.8 g/dL (ref 12.0–15.0)
MCH: 29.2 pg (ref 26.0–34.0)
MCHC: 33.5 g/dL (ref 30.0–36.0)
MCV: 87.3 fL (ref 80.0–100.0)
Platelets: 361 10*3/uL (ref 150–400)
RBC: 4.72 MIL/uL (ref 3.87–5.11)
RDW: 12.1 % (ref 11.5–15.5)
WBC: 9.8 10*3/uL (ref 4.0–10.5)
nRBC: 0 % (ref 0.0–0.2)

## 2020-06-04 LAB — POC URINE PREG, ED: Preg Test, Ur: NEGATIVE

## 2020-06-04 MED ORDER — OXYCODONE-ACETAMINOPHEN 5-325 MG PO TABS: 1.0000 | ORAL_TABLET | ORAL | 0 refills | Status: DC | PRN

## 2020-06-04 MED ORDER — ONDANSETRON HCL 4 MG/2ML IJ SOLN
INTRAMUSCULAR | Status: AC
  Administered 2020-06-04: 4 mg via INTRAVENOUS
  Filled 2020-06-04: qty 2

## 2020-06-04 MED ORDER — SODIUM CHLORIDE 0.9 % IV BOLUS
1000.0000 mL | Freq: Once | INTRAVENOUS | Status: AC
  Administered 2020-06-04: 1000 mL via INTRAVENOUS

## 2020-06-04 MED ORDER — FENTANYL CITRATE (PF) 100 MCG/2ML IJ SOLN: 50.0000 ug | INTRAMUSCULAR | Status: DC | PRN

## 2020-06-04 MED ORDER — KETOROLAC TROMETHAMINE 30 MG/ML IJ SOLN
15.0000 mg | Freq: Once | INTRAMUSCULAR | Status: AC
  Administered 2020-06-04: 15 mg via INTRAVENOUS
  Filled 2020-06-04: qty 1

## 2020-06-04 MED ORDER — ONDANSETRON 4 MG PO TBDP: 4.0000 mg | ORAL_TABLET | Freq: Three times a day (TID) | ORAL | 0 refills | Status: DC | PRN

## 2020-06-04 MED ORDER — ONDANSETRON HCL 4 MG/2ML IJ SOLN: 4.0000 mg | Freq: Once | INTRAMUSCULAR | Status: AC

## 2020-06-04 MED ORDER — FENTANYL CITRATE (PF) 100 MCG/2ML IJ SOLN
INTRAMUSCULAR | Status: AC
  Administered 2020-06-04: 50 ug via INTRAVENOUS
  Filled 2020-06-04: qty 2

## 2020-06-04 MED ORDER — OXYCODONE-ACETAMINOPHEN 5-325 MG PO TABS
1.0000 | ORAL_TABLET | Freq: Once | ORAL | Status: AC
  Administered 2020-06-04: 1 via ORAL
  Filled 2020-06-04: qty 1

## 2020-06-04 NOTE — ED Provider Notes (Signed)
[facility] Emergency Department Provider Note   ____________________________________________   Event Date/Time   First MD Initiated Contact with Patient 06/04/20 [id number]     (approximate)  I have reviewed the triage vital signs and the nursing notes.   HISTORY  Chief Complaint Flank Pain    HPI Grace Mendoza is a 25 y.o. female with no significant past medical history who presents to the ED complaining of flank pain.  Patient reports that she woke up early this morning with pain in her right lower flank.  Pain is described as sharp, was initially relatively mild and patient thought it was associated with her menses.  It has gradually worsened since then and become severe, associated with nausea and multiple episodes of vomiting.  Pain has begun to radiate towards the right lower quadrant of her abdomen and is exacerbated by bending over or other changes in position.  She denies any associated diarrhea, dysuria, hematuria, or fever.  She denies any history of kidney stones, although her mother has had them in the past.  She denies any history of ovarian cysts.        Past Medical History:  Diagnosis Date  . Bronchitis   . Family history of ovarian cancer    5/22 cancer genetic testing letter sent  . Headache    migraines    Patient Active Problem List   Diagnosis Date Noted  . Pilonidal cyst 07/20/2016    Past Surgical History:  Procedure Laterality Date  . PILONIDAL CYST EXCISION N/A 12/05/2017   Procedure: CYST EXCISION PILONIDAL EXTENSIVE;  Surgeon: Earline Mayotte, MD;  Location: [facility] ORS;  Service: General;  Laterality: N/A;  . TONSILLECTOMY  2004    Prior to Admission medications   Medication Sig Start Date End Date Taking? Authorizing Provider  ondansetron (ZOFRAN ODT) 4 MG disintegrating tablet Take 1 tablet (4 mg total) by mouth every 8 (eight) hours as needed for nausea or vomiting. 06/04/20  Yes Chesley Noon, MD   oxyCODONE-acetaminophen (PERCOCET) 5-325 MG tablet Take 1 tablet by mouth every 4 (four) hours as needed for severe pain. 06/04/20 06/04/21 Yes Chesley Noon, MD  diphenhydrAMINE (BENADRYL) 25 MG tablet Take 25 mg by mouth every 6 (six) hours as needed.    [provider]  NIKKI 3-0.02 MG tablet Take 1 tablet by mouth daily. 04/30/20   Natale Milch, MD    Allergies Patient has no known allergies.  Family History  Problem Relation Age of Onset  . Ovarian cancer Paternal Aunt 27    Social History Social History   Tobacco Use  . Smoking status: Never Smoker  . Smokeless tobacco: Never Used  Vaping Use  . Vaping Use: Never used  Substance Use Topics  . Alcohol use: No  . Drug use: No    Review of Systems  Constitutional: No fever/chills Eyes: No visual changes. ENT: No sore throat. Cardiovascular: Denies chest pain. Respiratory: Denies shortness of breath. Gastrointestinal: Positive for flank and abdominal pain.  Positive for nausea and vomiting.  No diarrhea.  No constipation. Genitourinary: Negative for dysuria. Musculoskeletal: Negative for back pain. Skin: Negative for rash. Neurological: Negative for headaches, focal weakness or numbness.  ____________________________________________   PHYSICAL EXAM:  VITAL SIGNS: ED Triage Vitals  Enc Vitals Group     BP 06/04/20 0856 104/63     Pulse Rate 06/04/20 0856 98     Resp 06/04/20 0856 18     Temp 06/04/20 0900 98.4 F (  36.9 C)     Temp Source 06/04/20 0856 Oral     SpO2 06/04/20 0856 100 %     Weight 06/04/20 0857 175 lb (79.4 kg)     Height 06/04/20 0857 5\' 4"  (1.626 m)     Head Circumference --      Peak Flow --      Pain Score 06/04/20 0857 10     Pain Loc --      Pain Edu? --      Excl. in GC? --     Constitutional: Alert and oriented. Eyes: Conjunctivae are normal. Head: Atraumatic. Nose: No congestion/rhinnorhea. Mouth/Throat: Mucous membranes are moist. Neck: Normal  ROM Cardiovascular: Normal rate, regular rhythm. Grossly normal heart sounds. Respiratory: Normal respiratory effort.  No retractions. Lungs CTAB. Gastrointestinal: Soft and nontender.  No CVA tenderness bilaterally.  No distention. Genitourinary: deferred Musculoskeletal: No lower extremity tenderness nor edema. Neurologic:  Normal speech and language. No gross focal neurologic deficits are appreciated. Skin:  Skin is warm, dry and intact. No rash noted. Psychiatric: Mood and affect are normal. Speech and behavior are normal.  ____________________________________________   LABS (all labs ordered are listed, but only abnormal results are displayed)  Labs Reviewed  URINALYSIS, COMPLETE (UACMP) WITH MICROSCOPIC - Abnormal; Notable for the following components:      Result Value   Color, Urine YELLOW (*)    APPearance HAZY (*)    Hgb urine dipstick LARGE (*)    Leukocytes,Ua SMALL (*)    RBC / HPF >50 (*)    Bacteria, UA RARE (*)    All other components within normal limits  BASIC METABOLIC PANEL - Abnormal; Notable for the following components:   Glucose, Bld 135 (*)    Calcium 8.8 (*)    All other components within normal limits  CBC  POC URINE PREG, ED    PROCEDURES  Procedure(s) performed (including Critical Care):  Procedures   ____________________________________________   INITIAL IMPRESSION / ASSESSMENT AND PLAN / ED COURSE       25 year old female with no significant past medical history presents to the ED with acute onset right flank pain radiating towards the right lower quadrant of her abdomen.  Symptoms seem consistent with a kidney stone and we will further assess with CT scan, low suspicion for appendicitis as she has no lower right lower quadrant tenderness.  Pregnancy testing is negative and UA shows blood but no signs of infection, labs are reassuring.  Patient with minimal improvement in pain following dose of fentanyl, we will give dose of Toradol as  well as IV fluid bolus.  Nausea has resolved following dose of Zofran.  CT scan reviewed by me and shows 3 mm distal ureterolithiasis with associated hydronephrosis.  Stone is at the area of the right UVJ per radiology.  She is appropriate for discharge home with urology follow-up as needed, will be prescribed short course of pain and nausea medication.  She was counseled to return to the ED for any new or worsening symptoms, patient agrees with plan.      ____________________________________________   FINAL CLINICAL IMPRESSION(S) / ED DIAGNOSES  Final diagnoses:  Right flank pain  Kidney stone     ED Discharge Orders         Ordered    oxyCODONE-acetaminophen (PERCOCET) 5-325 MG tablet  Every 4 hours PRN        06/04/20 1154    ondansetron (ZOFRAN ODT) 4 MG disintegrating tablet  Every 8 hours  PRN        06/04/20 1154           Note:  This document was prepared using Dragon voice recognition software and may include unintentional dictation errors.   Chesley Noon, MD 06/04/20 [phone]

## 2020-06-04 NOTE — ED Triage Notes (Signed)
Pt sent from [facility] with c/o right flank pain with N/V that started this morning, denies a hx of kidney stones, pt is dry heaving in triage

## 2020-06-05 ENCOUNTER — Inpatient Hospital Stay
Admission: EM | Admit: 2020-06-05 | Discharge: 2020-06-07 | DRG: 661 | Disposition: A | Discharge status: Home / Self Care | Payer: BC Managed Care – PPO | Attending: Obstetrics and Gynecology | Admitting: Obstetrics and Gynecology

## 2020-06-05 ENCOUNTER — Other Ambulatory Visit: Payer: Self-pay

## 2020-06-05 ENCOUNTER — Emergency Department: Payer: BC Managed Care – PPO

## 2020-06-05 ENCOUNTER — Encounter: Payer: Self-pay | Admitting: Intensive Care

## 2020-06-05 DIAGNOSIS — Z20822 Contact with and (suspected) exposure to covid-19: Secondary | ICD-10-CM | POA: Diagnosis present

## 2020-06-05 DIAGNOSIS — N390 Urinary tract infection, site not specified: Secondary | ICD-10-CM

## 2020-06-05 DIAGNOSIS — I10 Essential (primary) hypertension: Secondary | ICD-10-CM | POA: Diagnosis present

## 2020-06-05 DIAGNOSIS — N10 Acute tubulo-interstitial nephritis: Secondary | ICD-10-CM | POA: Diagnosis not present

## 2020-06-05 DIAGNOSIS — N136 Pyonephrosis: Secondary | ICD-10-CM | POA: Diagnosis present

## 2020-06-05 DIAGNOSIS — R000 Tachycardia, unspecified: Secondary | ICD-10-CM | POA: Diagnosis present

## 2020-06-05 DIAGNOSIS — N132 Hydronephrosis with renal and ureteral calculous obstruction: Secondary | ICD-10-CM

## 2020-06-05 DIAGNOSIS — N134 Hydroureter: Secondary | ICD-10-CM

## 2020-06-05 DIAGNOSIS — N200 Calculus of kidney: Principal | ICD-10-CM

## 2020-06-05 DIAGNOSIS — N1330 Unspecified hydronephrosis: Secondary | ICD-10-CM

## 2020-06-05 DIAGNOSIS — N12 Tubulo-interstitial nephritis, not specified as acute or chronic: Secondary | ICD-10-CM | POA: Diagnosis present

## 2020-06-05 DIAGNOSIS — Z91030 Bee allergy status: Secondary | ICD-10-CM

## 2020-06-05 DIAGNOSIS — N23 Unspecified renal colic: Secondary | ICD-10-CM

## 2020-06-05 DIAGNOSIS — Z8041 Family history of malignant neoplasm of ovary: Secondary | ICD-10-CM

## 2020-06-05 DIAGNOSIS — N201 Calculus of ureter: Secondary | ICD-10-CM

## 2020-06-05 HISTORY — DX: Calculus of kidney: N20.0

## 2020-06-05 LAB — BASIC METABOLIC PANEL
Anion gap: 10 (ref 5–15)
BUN: 13 mg/dL (ref 6–20)
CO2: 23 mmol/L (ref 22–32)
Calcium: 9.2 mg/dL (ref 8.9–10.3)
Chloride: 102 mmol/L (ref 98–111)
Creatinine, Ser: 1.25 mg/dL — ABNORMAL HIGH (ref 0.44–1.00)
GFR, Estimated: >60 mL/min (ref >60)
Glucose, Bld: 107 mg/dL — ABNORMAL HIGH (ref 70–99)
Potassium: 3.7 mmol/L (ref 3.5–5.1)
Sodium: 135 mmol/L (ref 135–145)

## 2020-06-05 LAB — LACTIC ACID, PLASMA
Lactic Acid, Venous: 0.9 mmol/L (ref 0.5–1.9)
Lactic Acid, Venous: 1 mmol/L (ref 0.5–1.9)

## 2020-06-05 LAB — URINALYSIS, COMPLETE (UACMP) WITH MICROSCOPIC
Bilirubin Urine: NEGATIVE
Glucose, UA: NEGATIVE mg/dL
Ketones, ur: 20 mg/dL — AB
Nitrite: NEGATIVE
Protein, ur: 30 mg/dL — AB
Specific Gravity, Urine: 1.025 (ref 1.005–1.030)
pH: 6 (ref 5.0–8.0)

## 2020-06-05 LAB — CBC
HCT: 42.1 % (ref 36.0–46.0)
Hemoglobin: 14.3 g/dL (ref 12.0–15.0)
MCH: 29.6 pg (ref 26.0–34.0)
MCHC: 34 g/dL (ref 30.0–36.0)
MCV: 87.2 fL (ref 80.0–100.0)
Platelets: 294 10*3/uL (ref 150–400)
RBC: 4.83 MIL/uL (ref 3.87–5.11)
RDW: 12.3 % (ref 11.5–15.5)
WBC: 16.2 10*3/uL — ABNORMAL HIGH (ref 4.0–10.5)
nRBC: 0 % (ref 0.0–0.2)

## 2020-06-05 LAB — PREGNANCY, URINE: Preg Test, Ur: NEGATIVE

## 2020-06-05 MED ORDER — MORPHINE SULFATE (PF) 4 MG/ML IV SOLN
4.0000 mg | Freq: Once | INTRAVENOUS | Status: AC
  Administered 2020-06-05: 4 mg via INTRAVENOUS
  Filled 2020-06-05: qty 1

## 2020-06-05 MED ORDER — SODIUM CHLORIDE 0.9 % IV SOLN
1.0000 g | INTRAVENOUS | Status: DC
  Administered 2020-06-06 – 2020-06-07 (×2): 1 g via INTRAVENOUS
  Filled 2020-06-05: qty 1
  Filled 2020-06-05: qty 10

## 2020-06-05 MED ORDER — MORPHINE SULFATE (PF) 2 MG/ML IV SOLN
2.0000 mg | INTRAVENOUS | Status: DC | PRN
Start: 2020-06-05 — End: 2020-06-07
  Filled 2020-06-05: qty 1

## 2020-06-05 MED ORDER — ONDANSETRON HCL 4 MG/2ML IJ SOLN: 4.0000 mg | Freq: Four times a day (QID) | INTRAMUSCULAR | Status: DC | PRN

## 2020-06-05 MED ORDER — TAMSULOSIN HCL 0.4 MG PO CAPS
0.8000 mg | ORAL_CAPSULE | Freq: Once | ORAL | Status: AC
  Administered 2020-06-05: 0.8 mg via ORAL
  Filled 2020-06-05: qty 2

## 2020-06-05 MED ORDER — SENNOSIDES-DOCUSATE SODIUM 8.6-50 MG PO TABS: 1.0000 | ORAL_TABLET | Freq: Every evening | ORAL | Status: DC | PRN

## 2020-06-05 MED ORDER — SODIUM CHLORIDE 0.9 % IV SOLN
1.0000 g | Freq: Once | INTRAVENOUS | Status: AC
  Administered 2020-06-05: 1 g via INTRAVENOUS
  Filled 2020-06-05: qty 10

## 2020-06-05 MED ORDER — ACETAMINOPHEN 325 MG PO TABS
650.0000 mg | ORAL_TABLET | Freq: Four times a day (QID) | ORAL | Status: DC | PRN
  Administered 2020-06-06: 650 mg via ORAL
  Filled 2020-06-05: qty 2

## 2020-06-05 MED ORDER — KETOROLAC TROMETHAMINE 30 MG/ML IJ SOLN
30.0000 mg | Freq: Four times a day (QID) | INTRAMUSCULAR | Status: DC | PRN
  Administered 2020-06-06 – 2020-06-07 (×4): 30 mg via INTRAVENOUS
  Filled 2020-06-05 (×4): qty 1

## 2020-06-05 MED ORDER — DROPERIDOL 2.5 MG/ML IJ SOLN
1.2500 mg | Freq: Once | INTRAMUSCULAR | Status: AC
  Administered 2020-06-05: 1.25 mg via INTRAVENOUS
  Filled 2020-06-05: qty 2

## 2020-06-05 MED ORDER — SODIUM CHLORIDE 0.9 % IV SOLN: INTRAVENOUS | Status: DC

## 2020-06-05 MED ORDER — TAMSULOSIN HCL 0.4 MG PO CAPS
0.4000 mg | ORAL_CAPSULE | Freq: Every day | ORAL | Status: DC
  Administered 2020-06-06: 0.4 mg via ORAL
  Filled 2020-06-05: qty 1

## 2020-06-05 MED ORDER — SODIUM CHLORIDE 0.9 % IV BOLUS
1000.0000 mL | Freq: Once | INTRAVENOUS | Status: AC
  Administered 2020-06-05: 1000 mL via INTRAVENOUS

## 2020-06-05 MED ORDER — ACETAMINOPHEN 650 MG RE SUPP: 650.0000 mg | Freq: Four times a day (QID) | RECTAL | Status: DC | PRN

## 2020-06-05 MED ORDER — HYDROCODONE-ACETAMINOPHEN 5-325 MG PO TABS
1.0000 | ORAL_TABLET | ORAL | Status: DC | PRN
  Filled 2020-06-05: qty 1

## 2020-06-05 MED ORDER — ONDANSETRON HCL 4 MG PO TABS: 4.0000 mg | ORAL_TABLET | Freq: Four times a day (QID) | ORAL | Status: DC | PRN

## 2020-06-05 MED ORDER — KETOROLAC TROMETHAMINE 30 MG/ML IJ SOLN
30.0000 mg | Freq: Once | INTRAMUSCULAR | Status: AC
  Administered 2020-06-05: 30 mg via INTRAVENOUS
  Filled 2020-06-05: qty 1

## 2020-06-05 NOTE — ED Triage Notes (Signed)
C/o pain and nausea from kidney stone diagnosed yesterday. Seen in ER and sent home with nausea and pain medication. Reports she is unable to keep anything down

## 2020-06-05 NOTE — ED Notes (Signed)
First nurse: pt has been taking meds as prescribed with no relief. Unable to keep fluids or food down since Thursday. Told to come back.

## 2020-06-05 NOTE — ED Notes (Signed)
Patient reports dx of kidney stone yesterday. Patient reports she was given Percocet, and Zofran, but has not had improvement to symptoms, and has not passed the stone. Patient reports difficulty keeping food and drink down.

## 2020-06-05 NOTE — H&P (Signed)
History and Physical    Grace Mendoza ZOX:[id number] DOB: 05/02/1995 DOA: 06/05/2020  PCP: Erick ColaceMinter, Karin, MD   Patient coming from: Home  I have personally briefly reviewed patient's old medical [facility]  Chief Complaint: Right flank pain abdominal, vomiting  HPI: Grace SpiceLindsay Anne Luty is a 25 y.o. female Previously with no significant medical history, seen and treated in the emergency room the day prior for renal colic secondary to 3 mm distal ureterolithiasis with hydronephrosis, discharged on oxycodone and Zofran who presents again to the emergency room with continued right flank pain with protracted nausea and vomiting, unable to keep anything down.  She denies fever or chills, chest pain or shortness of breath or cough and denies change in bowel habits. ED course: On arrival, afebrile with BP 104/63, pulse 98 and O2 sat 100% on room air blood work significant for leukocytosis of 16,000 up from 9000 the day prior, with few bacteria on urinalysis compared to rare bacteria the day prior Imaging: Repeat CT renal stone study showed slight advancement of the obstructing 3 mm stone just above the right ureterovesical junction with moderate right-sided nephrosis as well as associated right perinephric stranding  The ED provider spoke with urologist, Dr. Alvester MorinBell who recommended admission to medical service for IV antibiotics and Flomax.  If little improvement with conservative we will consider cystoscopy in the a.m.  Patient started on Rocephin.  Hospitalist consulted for admission.  Review of Systems: As per HPI otherwise all other systems on review of systems negative.    Past Medical History:  Diagnosis Date  . Bronchitis   . Family history of ovarian cancer    5/22 cancer genetic testing letter sent  . Headache    migraines  . Kidney stones     Past Surgical History:  Procedure Laterality Date  . PILONIDAL CYST EXCISION N/A 12/05/2017   Procedure: CYST EXCISION  PILONIDAL EXTENSIVE;  Surgeon: Earline MayotteByrnett, Jeffrey W, MD;  Location: [facility] ORS;  Service: General;  Laterality: N/A;  . TONSILLECTOMY  2004     reports that she has never smoked. She has never used smokeless tobacco. She reports that she does not drink alcohol and does not use drugs.  Allergies  Allergen Reactions  . Bee Venom     Family History  Problem Relation Age of Onset  . Ovarian cancer Paternal Aunt [phone]      Prior to Admission medications   Medication Sig Start Date End Date Taking? Authorizing Provider  diphenhydrAMINE (BENADRYL) 25 MG tablet Take 25 mg by mouth every 6 (six) hours as needed.    [provider]  NIKKI 3-0.02 MG tablet Take 1 tablet by mouth daily. 04/30/20   Schuman, Jaquelyn Bitterhristanna R, MD  ondansetron (ZOFRAN ODT) 4 MG disintegrating tablet Take 1 tablet (4 mg total) by mouth every 8 (eight) hours as needed for nausea or vomiting. 06/04/20   Chesley NoonJessup, Charles, MD  oxyCODONE-acetaminophen (PERCOCET) 5-325 MG tablet Take 1 tablet by mouth every 4 (four) hours as needed for severe pain. 06/04/20 06/04/21  Chesley NoonJessup, Charles, MD    Physical Exam: Vitals:   06/05/20 1541 06/05/20 1542 06/05/20 1544 06/05/20 1912  BP: (!) 155/100   (!) 136/93  Pulse: (!) 130   96  Resp: 18   18  Temp:   99.2 F (37.3 C) (!) 97.5 F (36.4 C)  TempSrc:   Oral Oral  SpO2: 97%   99%  Weight:  79.4 kg    Height:  5'  4.5" (1.638 m)       Vitals:   06/05/20 1541 06/05/20 1542 06/05/20 1544 06/05/20 1912  BP: (!) 155/100   (!) 136/93  Pulse: (!) 130   96  Resp: 18   18  Temp:   99.2 F (37.3 C) (!) 97.5 F (36.4 C)  TempSrc:   Oral Oral  SpO2: 97%   99%  Weight:  79.4 kg    Height:  5' 4.5" (1.638 m)        Constitutional: Alert and oriented x 3 . Not in any apparent distress HEENT:      Head: Normocephalic and atraumatic.         Eyes: PERLA, EOMI, Conjunctivae are normal. Sclera is non-icteric.       Mouth/Throat: Mucous membranes are moist.       Neck: Supple with no  signs of meningismus. Cardiovascular: Regular rate and rhythm. No murmurs, gallops, or rubs. 2+ symmetrical distal pulses are present . No JVD. No LE edema Respiratory: Respiratory effort normal .Lungs sounds clear bilaterally. No wheezes, crackles, or rhonchi.  Gastrointestinal: Soft, non tender, and non distended with positive bowel sounds.  Genitourinary: No CVA tenderness. Musculoskeletal: Nontender with normal range of motion in all extremities. No cyanosis, or erythema of extremities. Neurologic:  Face is symmetric. Moving all extremities. No gross focal neurologic deficits . Skin: Skin is warm, dry.  No rash or ulcers Psychiatric: Mood and affect are normal    Labs on Admission: I have personally reviewed following labs and imaging studies  CBC: Recent Labs  Lab 06/04/20 0902 06/05/20 1543  WBC 9.8 16.2*  HGB 13.8 14.3  HCT 41.2 42.1  MCV 87.3 87.2  PLT 361 294   Basic Metabolic Panel: Recent Labs  Lab 06/04/20 0902 06/05/20 1543  NA 138 135  K 4.0 3.7  CL 105 102  CO2 23 23  GLUCOSE 135* 107*  BUN 13 13  CREATININE 0.99 1.25*  CALCIUM 8.8* 9.2   GFR: Estimated Creatinine Clearance: 72.8 mL/min (A) (by C-G formula based on SCr of 1.25 mg/dL (H)). Liver Function Tests: No results for input(s): AST, ALT, ALKPHOS, BILITOT, PROT, ALBUMIN in the last 168 hours. No results for input(s): LIPASE, AMYLASE in the last 168 hours. No results for input(s): AMMONIA in the last 168 hours. Coagulation Profile: No results for input(s): INR, PROTIME in the last 168 hours. Cardiac Enzymes: No results for input(s): CKTOTAL, CKMB, CKMBINDEX, TROPONINI in the last 168 hours. BNP (last 3 results) No results for input(s): PROBNP in the last 8760 hours. HbA1C: No results for input(s): HGBA1C in the last 72 hours. CBG: No results for input(s): GLUCAP in the last 168 hours. Lipid Profile: No results for input(s): CHOL, HDL, LDLCALC, TRIG, CHOLHDL, LDLDIRECT in the last 72  hours. Thyroid Function Tests: No results for input(s): TSH, T4TOTAL, FREET4, T3FREE, THYROIDAB in the last 72 hours. Anemia Panel: No results for input(s): VITAMINB12, FOLATE, FERRITIN, TIBC, IRON, RETICCTPCT in the last 72 hours. Urine analysis:    Component Value Date/Time   COLORURINE YELLOW (A) 06/05/2020 1543   APPEARANCEUR HAZY (A) 06/05/2020 1543   LABSPEC 1.025 06/05/2020 1543   PHURINE 6.0 06/05/2020 1543   GLUCOSEU NEGATIVE 06/05/2020 1543   HGBUR LARGE (A) 06/05/2020 1543   BILIRUBINUR NEGATIVE 06/05/2020 1543   KETONESUR 20 (A) 06/05/2020 1543   PROTEINUR 30 (A) 06/05/2020 1543   NITRITE NEGATIVE 06/05/2020 1543   LEUKOCYTESUR MODERATE (A) 06/05/2020 1543    Radiological Exams on Admission:  CT Renal Stone Study  Result Date: 06/05/2020 CLINICAL DATA:  Urinary tract stone. EXAM: CT ABDOMEN AND PELVIS WITHOUT CONTRAST TECHNIQUE: Multidetector CT imaging of the abdomen and pelvis was performed following the standard protocol without IV contrast. COMPARISON:  Jun 04, 2020 FINDINGS: Lower chest: No acute abnormality. Hepatobiliary: No focal liver abnormality is seen. No gallstones, gallbladder wall thickening, or biliary dilatation. Pancreas: Unremarkable. No pancreatic ductal dilatation or surrounding inflammatory changes. Spleen: Normal in size without focal abnormality. Adrenals/Urinary Tract: Normal adrenal glands. Moderate right hydronephrosis and hydroureter likely caused by 2 mm calculus at the right vesicoureteral junction. The calculus has slightly advanced in positioning from the prior study. Additional bilateral nonobstructive renal calculi, the largest in the lower pole of the left kidney measures 3.3 mm. Stomach/Bowel: Stomach is within normal limits. Appendix appears normal. No evidence of bowel wall thickening, distention, or inflammatory changes. Vascular/Lymphatic: No significant vascular findings are present. No enlarged abdominal or pelvic lymph nodes. Reproductive:  Uterus and bilateral adnexa are unremarkable. Other: No abdominal wall hernia or abnormality. No abdominopelvic ascites. Musculoskeletal: No acute or significant osseous findings. IMPRESSION: 1. Moderate right hydronephrosis and hydroureter likely caused by 2 mm calculus at the right vesicoureteral junction. The calculus has slightly advanced in positioning from the prior study. Associated mild right perinephric stranding. 2. Additional bilateral nonobstructive renal calculi. Electronically Signed   By: Ted Mcalpine M.D.   On: 06/05/2020 19:06   CT Renal Stone Study  Result Date: 06/04/2020 CLINICAL DATA:  Flank pain.  Nausea and vomiting. EXAM: CT ABDOMEN AND PELVIS WITHOUT CONTRAST TECHNIQUE: Multidetector CT imaging of the abdomen and pelvis was performed following the standard protocol without IV contrast. COMPARISON:  None. FINDINGS: Lower chest: Insert lung bases Hepatobiliary: No focal liver abnormality is seen. No gallstones, gallbladder wall thickening, or biliary dilatation. Pancreas: Unremarkable. No pancreatic ductal dilatation or surrounding inflammatory changes. Spleen: Normal in size without focal abnormality. Adrenals/Urinary Tract: Adrenal glands are normal bilaterally. Five nonobstructing stones are present in the left kidney ranging from 2 mm to 4 mm. At least 3 small nonobstructing stones are present in the right kidney measuring up to 3 mm. Moderate right-sided hydronephrosis is present. There is some stranding about the right kidney. Right ureter is dilated to the level of a 3 mm obstructing stone just above the right ureterovesical junction. Urinary bladder is within normal limits. Stomach/Bowel: Stomach is within normal limits. Appendix appears normal. No evidence of bowel wall thickening, distention, or inflammatory changes. Vascular/Lymphatic: No significant vascular findings are present. No enlarged abdominal or pelvic lymph nodes. Reproductive: Uterus and bilateral adnexa are  unremarkable. Other: Minimal free fluid within the anatomic pelvis is likely physiologic. No free fluid is present about the right kidney or ureter. No free air is present. No significant ventral hernia is present. Musculoskeletal: Vertebral body heights and alignment are normal. No focal lytic or blastic lesion is present. The bony pelvis is within normal limits. IMPRESSION: 1. Obstructing 3 mm stone just above the right ureterovesical junction with moderate right-sided hydronephrosis. 2. Multiple additional nonobstructing stones in both kidneys. Electronically Signed   By: Marin Roberts M.D.   On: 06/04/2020 10:43     Assessment/Plan 25 year old female failing outpatient management of Renal colic secondary to 3 mm distal obstructive ureterolithiasis with hydronephrosis    Acute pyelonephritis   Hydronephrosis with renal and ureteral calculus obstruction - Patient with right flank pain, nausea vomiting, WBC 16,000, up to UTI -Repeat CT renal stone study showed slight advancement of  the obstructing 3 mm stone just above the right ureterovesical junction with moderate right-sided nephrosis as well as associated right perinephric stranding - IV Rocephin, Flomax, IV fluid - Strain urine - Follow cultures - Urology consult - We will keep n.p.o. in case of procedure in the a.m.    DVT prophylaxis: None Code Status: full code  Family Communication:  none  Disposition Plan: Back to previous home environment Consults called: Urology Status: Observation    Andris Baumann MD [facility]     06/05/2020, 7:54 PM

## 2020-06-05 NOTE — Consult Note (Signed)
H&P Physician requesting consult: Lindajo Royal  Chief Complaint: Right ureteral calculus  History of Present Illness: 25 year old female presented with a 1 day history of right-sided flank pain yesterday.  She had a CT scan performed that showed a distal right ureteral calculus.  She subsequently felt better and was discharged.  However at home, she had persistent severe pain, nausea, vomiting.  She could not keep anything down.  She presents then back to the emergency department.  Another CT scan was performed that showed a 2 mm ureterovesicular junction calculus with moderate right hydronephrosis and hydroureter.  There was some mild perinephric stranding.  She denies any fever, dysuria.  Currently she does not have much pain.  She initially had some tachycardia and hypertension upon arrival but improved with pain control.  She did have leukocytosis of 16 and a urinalysis with moderate leukocyte, few bacteria, 21-50 WBCs.  She has never had stones before.  Past Medical History:  Diagnosis Date  . Bronchitis   . Family history of ovarian cancer    5/22 cancer genetic testing letter sent  . Headache    migraines  . Kidney stones    Past Surgical History:  Procedure Laterality Date  . PILONIDAL CYST EXCISION N/A 12/05/2017   Procedure: CYST EXCISION PILONIDAL EXTENSIVE;  Surgeon: Earline Mayotte, MD;  Location: [facility] ORS;  Service: General;  Laterality: N/A;  . TONSILLECTOMY  2004    Home Medications:  (Not in a hospital admission)  Allergies:  Allergies  Allergen Reactions  . Bee Venom     Family History  Problem Relation Age of Onset  . Ovarian cancer Paternal Aunt 67   Social History:  reports that she has never smoked. She has never used smokeless tobacco. She reports that she does not drink alcohol and does not use drugs.  ROS: A complete review of systems was performed.  All systems are negative except for pertinent findings as noted. ROS   Physical Exam:  Vital  signs in last 24 hours: Temp:  [97.5 F (36.4 C)-99.2 F (37.3 C)] 97.5 F (36.4 C) (05/28 1912) Pulse Rate:  [96-130] 96 (05/28 1912) Resp:  [18] 18 (05/28 1912) BP: (136-155)/(93-100) 136/93 (05/28 1912) SpO2:  [97 %-99 %] 99 % (05/28 1912) Weight:  [79.4 kg] 79.4 kg (05/28 1542) General:  Alert and oriented, No acute distress HEENT: Normocephalic, atraumatic Neck: No JVD or lymphadenopathy Cardiovascular: Regular rate and rhythm Lungs: Regular rate and effort Abdomen: Soft, nontender, nondistended, no abdominal masses Back: No CVA tenderness Extremities: No edema Neurologic: Grossly intact  Laboratory Data:  Results for orders placed or performed during the hospital encounter of 06/05/20 (from the past 24 hour(s))  Basic metabolic panel     Status: Abnormal   Collection Time: 06/05/20  3:43 PM  Result Value Ref Range   Sodium 135 135 - 145 mmol/L   Potassium 3.7 3.5 - 5.1 mmol/L   Chloride 102 98 - 111 mmol/L   CO2 23 22 - 32 mmol/L   Glucose, Bld 107 (H) 70 - 99 mg/dL   BUN 13 6 - 20 mg/dL   Creatinine, Ser 5.28 (H) 0.44 - 1.00 mg/dL   Calcium 9.2 8.9 - 41.3 mg/dL   GFR, Estimated >24 >40 mL/min   Anion gap 10 5 - 15  CBC     Status: Abnormal   Collection Time: 06/05/20  3:43 PM  Result Value Ref Range   WBC 16.2 (H) 4.0 - 10.5 K/uL   RBC 4.83  3.87 - 5.11 MIL/uL   Hemoglobin 14.3 12.0 - 15.0 g/dL   HCT 62.1 30.8 - 65.7 %   MCV 87.2 80.0 - 100.0 fL   MCH 29.6 26.0 - 34.0 pg   MCHC 34.0 30.0 - 36.0 g/dL   RDW 84.6 96.2 - 95.2 %   Platelets 294 150 - 400 K/uL   nRBC 0.0 0.0 - 0.2 %  Urinalysis, Complete w Microscopic Urine, Clean Catch     Status: Abnormal   Collection Time: 06/05/20  3:43 PM  Result Value Ref Range   Color, Urine YELLOW (A) YELLOW   APPearance HAZY (A) CLEAR   Specific Gravity, Urine 1.025 1.005 - 1.030   pH 6.0 5.0 - 8.0   Glucose, UA NEGATIVE NEGATIVE mg/dL   Hgb urine dipstick LARGE (A) NEGATIVE   Bilirubin Urine NEGATIVE NEGATIVE    Ketones, ur 20 (A) NEGATIVE mg/dL   Protein, ur 30 (A) NEGATIVE mg/dL   Nitrite NEGATIVE NEGATIVE   Leukocytes,Ua MODERATE (A) NEGATIVE   RBC / HPF 6-10 0 - 5 RBC/hpf   WBC, UA 21-50 0 - 5 WBC/hpf   Bacteria, UA FEW (A) NONE SEEN   Squamous Epithelial / LPF 6-10 0 - 5   Mucus PRESENT   Pregnancy, urine     Status: None   Collection Time: 06/05/20  3:43 PM  Result Value Ref Range   Preg Test, Ur NEGATIVE NEGATIVE  Lactic acid, plasma     Status: None   Collection Time: 06/05/20  8:04 PM  Result Value Ref Range   Lactic Acid, Venous 0.9 0.5 - 1.9 mmol/L   No results found for this or any previous visit (from the past 240 hour(s)). Creatinine: Recent Labs    06/04/20 0902 06/05/20 1543  CREATININE 0.99 1.25*   CT scan personally reviewed and is detailed in history of present illness  Impression/Assessment:  Right ureteral calculus Right hydronephrosis/ureteral obstruction secondary to calculus Renal colic Possible UTI  Plan:  Patient's pain is currently controlled.  The stone is very small and at the ureterovesicular junction.  I discussed the concern for possible infection given her leukocytosis of 16 and borderline abnormal urinalysis.  Leukocytosis could also be secondary to dehydration, and her creatinine is a little bit up from prior consistent with this as well.  Vitals have stabilized and she is afebrile.  I presented the option of procedural intervention with a stent versus observation.  She would like to remain under observation and strain her urine which I think is reasonable given the tiny calculus at the ureterovesicular junction.  She has been given Flomax.  She is on IV fluids.  She received ceftriaxone.  She is straining her urine and hopefully she passes the stone.  I will call and check in on her in the morning.  If vitals or labs are concerning or she has severe pain, we will proceed with operative intervention.  Her and her mother expressed understanding and are in  full agreement.  Ray Church, III 06/05/2020, 10:17 PM

## 2020-06-05 NOTE — ED Provider Notes (Signed)
[facility] Emergency Department Provider Note  ____________________________________________  Time seen: Approximately 5:18 PM  I have reviewed the triage vital signs and the nursing notes.   HISTORY  Chief Complaint Nephrolithiasis    HPI Grace Mendoza is a 25 y.o. female who presents the emergency department complaining of worsening right flank pain, nausea, emesis.  Patient was seen yesterday, diagnosed with a kidney stone.  Her symptoms have been controlled here in the emergency department pain meds and nausea medicine but when she went home the pain has worsened and she has been able keep foods or liquids down.  Patient denies any fevers or chills.  Pain is now radiating from the back into the abdomen.  Patient denied any vaginal bleeding or discharge.  No dysuria with decreased urination compared to normal.         Past Medical History:  Diagnosis Date  . Bronchitis   . Family history of ovarian cancer    5/22 cancer genetic testing letter sent  . Headache    migraines  . Kidney stones     Patient Active Problem List   Diagnosis Date Noted  . Acute pyelonephritis 06/05/2020  . Hydronephrosis with renal and ureteral calculus obstruction 06/05/2020  . Pilonidal cyst 07/20/2016    Past Surgical History:  Procedure Laterality Date  . PILONIDAL CYST EXCISION N/A 12/05/2017   Procedure: CYST EXCISION PILONIDAL EXTENSIVE;  Surgeon: Earline Mayotte, MD;  Location: [facility] ORS;  Service: General;  Laterality: N/A;  . TONSILLECTOMY  2004    Prior to Admission medications   Medication Sig Start Date End Date Taking? Authorizing Provider  diphenhydrAMINE (BENADRYL) 25 MG tablet Take 25 mg by mouth every 6 (six) hours as needed.    [provider]  NIKKI 3-0.02 MG tablet Take 1 tablet by mouth daily. 04/30/20   Schuman, Jaquelyn Bitter, MD  ondansetron (ZOFRAN ODT) 4 MG disintegrating tablet Take 1 tablet (4 mg total) by mouth every 8 (eight)  hours as needed for nausea or vomiting. 06/04/20   Chesley Noon, MD  oxyCODONE-acetaminophen (PERCOCET) 5-325 MG tablet Take 1 tablet by mouth every 4 (four) hours as needed for severe pain. 06/04/20 06/04/21  Chesley Noon, MD    Allergies Bee venom  Family History  Problem Relation Age of Onset  . Ovarian cancer Paternal Aunt 67    Social History Social History   Tobacco Use  . Smoking status: Never Smoker  . Smokeless tobacco: Never Used  Vaping Use  . Vaping Use: Never used  Substance Use Topics  . Alcohol use: No  . Drug use: No     Review of Systems  Constitutional: No fever/chills Eyes: No visual changes. No discharge ENT: No upper respiratory complaints. Cardiovascular: no chest pain. Respiratory: no cough. No SOB. Gastrointestinal: No abdominal pain.  No nausea, no vomiting.  No diarrhea.  No constipation. Genitourinary: Negative for dysuria. No hematuria.  Increasing flank pain radiating into the right groin. Musculoskeletal: Negative for musculoskeletal pain. Skin: Negative for rash, abrasions, lacerations, ecchymosis. Neurological: Negative for headaches, focal weakness or numbness.  10 System ROS otherwise negative.  ____________________________________________   PHYSICAL EXAM:  VITAL SIGNS: ED Triage Vitals  Enc Vitals Group     BP 06/05/20 1541 (!) 155/100     Pulse Rate 06/05/20 1541 (!) 130     Resp 06/05/20 1541 18     Temp 06/05/20 1544 99.2 F (37.3 C)     Temp Source 06/05/20 1544 Oral  SpO2 06/05/20 1541 97 %     Weight 06/05/20 1542 175 lb (79.4 kg)     Height 06/05/20 1542 5' 4.5" (1.638 m)     Head Circumference --      Peak Flow --      Pain Score 06/05/20 1542 6     Pain Loc --      Pain Edu? --      Excl. in GC? --      Constitutional: Alert and oriented.  Ill appearing but in no acute distress. Eyes: Conjunctivae are normal. PERRL. EOMI. Head: Atraumatic. ENT:      Ears:       Nose: No congestion/rhinnorhea.       Mouth/Throat: Mucous membranes are moist.  Neck: No stridor.   Cardiovascular: Normal rate, regular rhythm. Normal S1 and S2.  Good peripheral circulation. Respiratory: Normal respiratory effort without tachypnea or retractions. Lungs CTAB. Good air entry to the bases with no decreased or absent breath sounds. Gastrointestinal: Bowel sounds 4 quadrants.  Soft to palpation all quadrants.  Patient is tender to palpation starting along the midaxillary line of the lateral abdominal wall extending into the right groin.. No guarding or rigidity. No palpable masses. No distention.  Positive for right-sided CVA tenderness. Musculoskeletal: Full range of motion to all extremities. No gross deformities appreciated. Neurologic:  Normal speech and language. No gross focal neurologic deficits are appreciated.  Skin:  Skin is warm, dry and intact. No rash noted. Psychiatric: Mood and affect are normal. Speech and behavior are normal. Patient exhibits appropriate insight and judgement.   ____________________________________________   LABS (all labs ordered are listed, but only abnormal results are displayed)  Labs Reviewed  BASIC METABOLIC PANEL - Abnormal; Notable for the following components:      Result Value   Glucose, Bld 107 (*)    Creatinine, Ser 1.25 (*)    All other components within normal limits  CBC - Abnormal; Notable for the following components:   WBC 16.2 (*)    All other components within normal limits  URINALYSIS, COMPLETE (UACMP) WITH MICROSCOPIC - Abnormal; Notable for the following components:   Color, Urine YELLOW (*)    APPearance HAZY (*)    Hgb urine dipstick LARGE (*)    Ketones, ur 20 (*)    Protein, ur 30 (*)    Leukocytes,Ua MODERATE (*)    Bacteria, UA FEW (*)    All other components within normal limits  URINE CULTURE  SARS CORONAVIRUS 2 (TAT 6-24 HRS)  PREGNANCY, URINE  LACTIC ACID, PLASMA  LACTIC ACID, PLASMA  HIV ANTIBODY (ROUTINE TESTING W REFLEX)  BASIC  METABOLIC PANEL  CBC   ____________________________________________  EKG   ____________________________________________  RADIOLOGY I personally viewed and evaluated these images as part of my medical decision making, as well as reviewing the written report by the radiologist.  ED Provider Interpretation: Concur with radiologist finding of hydronephrosis, hydroureter as well as some mild right-sided perinephric stranding.  There is a 2 mm calculus in the right vesicoureteral junction, calculus has slightly advanced from previous study.  CT Renal Stone Study  Result Date: 06/05/2020 CLINICAL DATA:  Urinary tract stone. EXAM: CT ABDOMEN AND PELVIS WITHOUT CONTRAST TECHNIQUE: Multidetector CT imaging of the abdomen and pelvis was performed following the standard protocol without IV contrast. COMPARISON:  Jun 04, 2020 FINDINGS: Lower chest: No acute abnormality. Hepatobiliary: No focal liver abnormality is seen. No gallstones, gallbladder wall thickening, or biliary dilatation. Pancreas: Unremarkable. No pancreatic  ductal dilatation or surrounding inflammatory changes. Spleen: Normal in size without focal abnormality. Adrenals/Urinary Tract: Normal adrenal glands. Moderate right hydronephrosis and hydroureter likely caused by 2 mm calculus at the right vesicoureteral junction. The calculus has slightly advanced in positioning from the prior study. Additional bilateral nonobstructive renal calculi, the largest in the lower pole of the left kidney measures 3.3 mm. Stomach/Bowel: Stomach is within normal limits. Appendix appears normal. No evidence of bowel wall thickening, distention, or inflammatory changes. Vascular/Lymphatic: No significant vascular findings are present. No enlarged abdominal or pelvic lymph nodes. Reproductive: Uterus and bilateral adnexa are unremarkable. Other: No abdominal wall hernia or abnormality. No abdominopelvic ascites. Musculoskeletal: No acute or significant osseous  findings. IMPRESSION: 1. Moderate right hydronephrosis and hydroureter likely caused by 2 mm calculus at the right vesicoureteral junction. The calculus has slightly advanced in positioning from the prior study. Associated mild right perinephric stranding. 2. Additional bilateral nonobstructive renal calculi. Electronically Signed   By: Ted Mcalpine M.D.   On: 06/05/2020 19:06    ____________________________________________    PROCEDURES  Procedure(s) performed:    Procedures    Medications  cefTRIAXone (ROCEPHIN) 1 g in sodium chloride 0.9 % 100 mL IVPB (1 g Intravenous New Bag/Given 06/05/20 1943)  tamsulosin (FLOMAX) capsule 0.8 mg (has no administration in time range)  ketorolac (TORADOL) 30 MG/ML injection 30 mg (has no administration in time range)  tamsulosin (FLOMAX) capsule 0.4 mg (has no administration in time range)  cefTRIAXone (ROCEPHIN) 1 g in sodium chloride 0.9 % 100 mL IVPB (has no administration in time range)  0.9 %  sodium chloride infusion (has no administration in time range)  acetaminophen (TYLENOL) tablet 650 mg (has no administration in time range)    Or  acetaminophen (TYLENOL) suppository 650 mg (has no administration in time range)  HYDROcodone-acetaminophen (NORCO/VICODIN) 5-325 MG per tablet 1-2 tablet (has no administration in time range)  ketorolac (TORADOL) 30 MG/ML injection 30 mg (has no administration in time range)  morphine 2 MG/ML injection 2 mg (has no administration in time range)  ondansetron (ZOFRAN) tablet 4 mg (has no administration in time range)    Or  ondansetron (ZOFRAN) injection 4 mg (has no administration in time range)  senna-docusate (Senokot-S) tablet 1 tablet (has no administration in time range)  sodium chloride 0.9 % bolus 1,000 mL (0 mLs Intravenous Stopped 06/05/20 1950)  morphine 4 MG/ML injection 4 mg (4 mg Intravenous Given 06/05/20 1730)  droperidol (INAPSINE) 2.5 MG/ML injection 1.25 mg (1.25 mg Intravenous Given  06/05/20 1740)  sodium chloride 0.9 % bolus 1,000 mL (1,000 mLs Intravenous New Bag/Given 06/05/20 1945)     ____________________________________________   INITIAL IMPRESSION / ASSESSMENT AND PLAN / ED COURSE  Pertinent labs & imaging results that were available during my care of the patient were reviewed by me and considered in my medical decision making (see chart for details).  Review of the [place] CSRS was performed in accordance of the [facility] prior to dispensing any controlled drugs.           Patient's diagnosis is consistent with nephrolithiasis, pyelonephritis.  Patient presented to the emergency department with increasing pain in the right flank and right lower abdomen.  Patient had been seen yesterday, diagnosed with a kidney stone.  At that time work-up had been pretty reassuring and patient had improved symptomatically after medications.  Symptoms have worsened including pain, nausea and vomiting.  She is unable to keep foods and liquids down at this time.  Medications prescribed or not alleviating the patient's symptoms.  Patient was ill-appearing but in no acute distress.  Given the patient's ongoing symptoms, she was given fluids, IV pain medication and droperidol for her ongoing nausea and vomiting.  Patient's tachycardia improved, she is remained afebrile while here.  Given the worsening symptoms, rising white blood cell count, findings of moderate leukocytosis with bacteria my concern was for infection superimposed on top of her kidney stone.  Kidney stone has progressed compared to yesterday's imaging but now patient has hydroureter as well as increasing hydronephrosis and perinephric stranding.  Given the clinical worsening I am concerned at this time for infection superimposed on top of this kidney stone.  I discussed the case with on-call urologist, Dr. Alvester MorinBell.  He advises the patient to have additional fluids, Flomax, antibiotics and admission to the hospitalist service.  Depending  how patient does clinically, she may be a candidate for a ureteral stent, however with the progression/movement of kidney stone it is hopeful that the patient will pass the stone without stent placement.  At this time the patient will be admitted to the hospitalist service.    ____________________________________________  FINAL CLINICAL IMPRESSION(S) / ED DIAGNOSES  Final diagnoses:  Nephrolithiasis  Pyelonephritis      NEW MEDICATIONS STARTED DURING THIS VISIT:  ED Discharge Orders    None          This chart was dictated using voice recognition software/Dragon. Despite best efforts to proofread, errors can occur which can change the meaning. Any change was purely unintentional.    Racheal PatchesCuthriell, Ehan Freas D, PA-C 06/05/20 Milda [id number]    Paduchowski, Kevin, MD 06/06/20 0005

## 2020-06-06 DIAGNOSIS — I10 Essential (primary) hypertension: Secondary | ICD-10-CM | POA: Diagnosis present

## 2020-06-06 DIAGNOSIS — N200 Calculus of kidney: Secondary | ICD-10-CM | POA: Diagnosis present

## 2020-06-06 DIAGNOSIS — Z20822 Contact with and (suspected) exposure to covid-19: Secondary | ICD-10-CM | POA: Diagnosis present

## 2020-06-06 DIAGNOSIS — R000 Tachycardia, unspecified: Secondary | ICD-10-CM | POA: Diagnosis present

## 2020-06-06 DIAGNOSIS — N201 Calculus of ureter: Secondary | ICD-10-CM | POA: Diagnosis not present

## 2020-06-06 DIAGNOSIS — N12 Tubulo-interstitial nephritis, not specified as acute or chronic: Secondary | ICD-10-CM | POA: Diagnosis present

## 2020-06-06 DIAGNOSIS — N10 Acute tubulo-interstitial nephritis: Secondary | ICD-10-CM | POA: Diagnosis present

## 2020-06-06 DIAGNOSIS — N136 Pyonephrosis: Secondary | ICD-10-CM | POA: Diagnosis present

## 2020-06-06 DIAGNOSIS — Z91030 Bee allergy status: Secondary | ICD-10-CM | POA: Diagnosis not present

## 2020-06-06 DIAGNOSIS — Z8041 Family history of malignant neoplasm of ovary: Secondary | ICD-10-CM | POA: Diagnosis not present

## 2020-06-06 LAB — CBC
HCT: 34.8 % — ABNORMAL LOW (ref 36.0–46.0)
Hemoglobin: 11.5 g/dL — ABNORMAL LOW (ref 12.0–15.0)
MCH: 29.6 pg (ref 26.0–34.0)
MCHC: 33 g/dL (ref 30.0–36.0)
MCV: 89.7 fL (ref 80.0–100.0)
Platelets: 207 10*3/uL (ref 150–400)
RBC: 3.88 MIL/uL (ref 3.87–5.11)
RDW: 12.3 % (ref 11.5–15.5)
WBC: 11.4 10*3/uL — ABNORMAL HIGH (ref 4.0–10.5)
nRBC: 0 % (ref 0.0–0.2)

## 2020-06-06 LAB — BASIC METABOLIC PANEL
Anion gap: 5 (ref 5–15)
BUN: 9 mg/dL (ref 6–20)
CO2: 23 mmol/L (ref 22–32)
Calcium: 8 mg/dL — ABNORMAL LOW (ref 8.9–10.3)
Chloride: 109 mmol/L (ref 98–111)
Creatinine, Ser: 1.06 mg/dL — ABNORMAL HIGH (ref 0.44–1.00)
GFR, Estimated: >60 mL/min (ref >60)
Glucose, Bld: 102 mg/dL — ABNORMAL HIGH (ref 70–99)
Potassium: 3.6 mmol/L (ref 3.5–5.1)
Sodium: 137 mmol/L (ref 135–145)

## 2020-06-06 LAB — SARS CORONAVIRUS 2 (TAT 6-24 HRS): SARS Coronavirus 2: NEGATIVE

## 2020-06-06 LAB — HIV ANTIBODY (ROUTINE TESTING W REFLEX): HIV Screen 4th Generation wRfx: NONREACTIVE

## 2020-06-06 MED ORDER — NORETHIN-ETH ESTRADIOL-FE 0.4-35 MG-MCG PO CHEW: 1.0000 | CHEWABLE_TABLET | Freq: Every day | ORAL | Status: DC

## 2020-06-06 NOTE — Progress Notes (Signed)
Urology Inpatient Progress Report  Nephrolithiasis [N20.0] Pyelonephritis [N12] Acute pyelonephritis [N10]        Intv/Subj: No acute events overnight. Patient is without complaint.  Pain is much improved.  White blood cell count 11.4, creatinine 1.06.  She remains afebrile with vital signs stable.  Currently watching her heart rate which remains borderline elevated.  However currently comfortable and does not appear toxic or infected.  Active Problems:   Acute pyelonephritis   Hydronephrosis with renal and ureteral calculus obstruction   Pyelonephritis  Current Facility-Administered Medications  Medication Dose Route Frequency Provider Last Rate Last Admin  . 0.9 %  sodium chloride infusion   Intravenous Continuous Andris Baumann, MD 125 mL/hr at 06/06/20 0610 New Bag at 06/06/20 0610  . acetaminophen (TYLENOL) tablet 650 mg  650 mg Oral Q6H PRN Andris Baumann, MD   650 mg at 06/06/20 2094   Or  . acetaminophen (TYLENOL) suppository 650 mg  650 mg Rectal Q6H PRN Andris Baumann, MD      . cefTRIAXone (ROCEPHIN) 1 g in sodium chloride 0.9 % 100 mL IVPB  1 g Intravenous Q24H Lindajo Royal V, MD 200 mL/hr at 06/06/20 0956 1 g at 06/06/20 0956  . HYDROcodone-acetaminophen (NORCO/VICODIN) 5-325 MG per tablet 1-2 tablet  1-2 tablet Oral Q4H PRN Andris Baumann, MD      . ketorolac (TORADOL) 30 MG/ML injection 30 mg  30 mg Intravenous Q6H PRN Andris Baumann, MD   30 mg at 06/06/20 0928  . morphine 2 MG/ML injection 2 mg  2 mg Intravenous Q2H PRN Andris Baumann, MD      . Kathrynn Running Estradiol-Fe [facility]) tablet 1 tablet  1 tablet Oral Daily Wouk, Wilfred Curtis, MD      . ondansetron [facility]) tablet 4 mg  4 mg Oral Q6H PRN Andris Baumann, MD       Or  . ondansetron [facility]) injection 4 mg  4 mg Intravenous Q6H PRN Andris Baumann, MD      . senna-docusate (Senokot-S) tablet 1 tablet  1 tablet Oral QHS PRN Andris Baumann, MD      . tamsulosin [facility]) capsule 0.4 mg  0.4 mg Oral  QPC breakfast Andris Baumann, MD         Objective: Vital: Vitals:   06/05/20 2247 06/06/20 0017 06/06/20 0539 06/06/20 0912  BP: 116/61 115/64 115/66 127/69  Pulse: 94 92 96 99  Resp: 16 16 16 20   Temp: 98.1 F (36.7 C) 98.1 F (36.7 C) 98.4 F (36.9 C) 98.6 F (37 C)  TempSrc:  Oral Oral   SpO2: 99% 100% 99% 100%  Weight:      Height:       I/Os: I/O last 3 completed shifts: In: 1100 [IV Piggyback:1100] Out: 525 [Urine:525]  Physical Exam:  General: Patient is in no apparent distress Lungs: Normal respiratory effort, chest expands symmetrically. GI: The abdomen is soft and nontender without mass. Ext: lower extremities symmetric  Lab Results: Recent Labs    06/04/20 0902 06/05/20 1543 06/06/20 0502  WBC 9.8 16.2* 11.4*  HGB 13.8 14.3 11.5*  HCT 41.2 42.1 34.8*   Recent Labs    06/04/20 0902 06/05/20 1543 06/06/20 0502  NA 138 135 137  K 4.0 3.7 3.6  CL 105 102 109  CO2 23 23 23   GLUCOSE 135* 107* 102*  BUN 13 13 9   CREATININE 0.99 1.25* 1.06*  CALCIUM 8.8* 9.2 8.0*   No  results for input(s): LABPT, INR in the last 72 hours. No results for input(s): LABURIN in the last 72 hours. Results for orders placed or performed during the hospital encounter of 06/05/20  SARS CORONAVIRUS 2 (TAT 6-24 HRS) Nasopharyngeal Nasopharyngeal Swab     Status: None   Collection Time: 06/05/20  8:04 PM   Specimen: Nasopharyngeal Swab  Result Value Ref Range Status   SARS Coronavirus 2 NEGATIVE NEGATIVE Final    Comment: (NOTE) SARS-CoV-2 target nucleic acids are NOT DETECTED.  The SARS-CoV-2 RNA is generally detectable in upper and lower respiratory specimens during the acute phase of infection. Negative results do not preclude SARS-CoV-2 infection, do not rule out co-infections with other pathogens, and should not be used as the sole basis for treatment or other patient management decisions. Negative results must be combined with clinical observations, patient  history, and epidemiological information. The expected result is Negative.  Fact Sheet for Patients: HairSlick.no  Fact Sheet for Healthcare Providers: quierodirigir.com  This test is not yet approved or cleared by the [place] FDA and  has been authorized for detection and/or diagnosis of SARS-CoV-2 by FDA under an Emergency Use Authorization (EUA). This EUA will remain  in effect (meaning this test can be used) for the duration of the COVID-19 declaration under Se ction 564(b)(1) of the Act, 21 U.S.C. section 360bbb-3(b)(1), unless the authorization is terminated or revoked sooner.  Performed at [facility], [address], [place], [place] [address]     Studies/Results: CT Renal Stone Study  Result Date: 06/05/2020 CLINICAL DATA:  Urinary tract stone. EXAM: CT ABDOMEN AND PELVIS WITHOUT CONTRAST TECHNIQUE: Multidetector CT imaging of the abdomen and pelvis was performed following the standard protocol without IV contrast. COMPARISON:  Jun 04, 2020 FINDINGS: Lower chest: No acute abnormality. Hepatobiliary: No focal liver abnormality is seen. No gallstones, gallbladder wall thickening, or biliary dilatation. Pancreas: Unremarkable. No pancreatic ductal dilatation or surrounding inflammatory changes. Spleen: Normal in size without focal abnormality. Adrenals/Urinary Tract: Normal adrenal glands. Moderate right hydronephrosis and hydroureter likely caused by 2 mm calculus at the right vesicoureteral junction. The calculus has slightly advanced in positioning from the prior study. Additional bilateral nonobstructive renal calculi, the largest in the lower pole of the left kidney measures 3.3 mm. Stomach/Bowel: Stomach is within normal limits. Appendix appears normal. No evidence of bowel wall thickening, distention, or inflammatory changes. Vascular/Lymphatic: No significant vascular findings are present. No enlarged abdominal  or pelvic lymph nodes. Reproductive: Uterus and bilateral adnexa are unremarkable. Other: No abdominal wall hernia or abnormality. No abdominopelvic ascites. Musculoskeletal: No acute or significant osseous findings. IMPRESSION: 1. Moderate right hydronephrosis and hydroureter likely caused by 2 mm calculus at the right vesicoureteral junction. The calculus has slightly advanced in positioning from the prior study. Associated mild right perinephric stranding. 2. Additional bilateral nonobstructive renal calculi. Electronically Signed   By: Ted Mcalpine M.D.   On: 06/05/2020 19:06    Assessment: , Right ureteral calculus Ureteral obstruction secondary to calculus   Plan: She will continue trial passage today.  I will make her n.p.o. at midnight.  If she does not pass the stone we will plan for right ureteroscopy with stone extraction, ureteral stent placement tomorrow.   Modena Slater, MD Urology 06/06/2020, 10:45 AM

## 2020-06-06 NOTE — Progress Notes (Signed)
PROGRESS NOTE    Grace Mendoza  UEA:[id number] DOB: 05/15/1995 DOA: 06/05/2020 PCP: Erick ColaceMinter, Karin, MD  Outpatient Specialists: none    Brief Narrative:    HPI: Grace CraftLindsay Anne Mendoza is a 25 y.o. female Previously with no significant medical history, seen and treated in the emergency room the day prior for renal colic secondary to 3 mm distal ureterolithiasis with hydronephrosis, discharged on oxycodone and Zofran who presents again to the emergency room with continued right flank pain with protracted nausea and vomiting, unable to keep anything down.  She denies fever or chills, chest pain or shortness of breath or cough and denies change in bowel habits. ED course: On arrival, afebrile with BP 104/63, pulse 98 and O2 sat 100% on room air blood work significant for leukocytosis of 16,000 up from 9000 the day prior, with few bacteria on urinalysis compared to rare bacteria the day prior   Assessment & Plan:   Active Problems:   Acute pyelonephritis   Hydronephrosis with renal and ureteral calculus obstruction      Acute pyelonephritis   Hydronephrosis with renal and ureteral calculus obstruction - Patient with right flank pain, nausea vomiting, WBC 16,000,  -Repeat CT renal stone study showed slight advancement of the obstructing 3 mm stone just above the right ureterovesical junction with moderate right-sided nephrosis as well as associated right perinephric stranding. N/v and pain much improved this morning. Urology following, plan to continue straining urine today, possible procedure tomorrow or earlier if symptoms worsen - IV Rocephin, Flomax, IV fluid - Strain urine - Follow cultures - Urology consult - npo ad midnight, possible procedure tomorrow   DVT prophylaxis: scds Code Status: full Family Communication: mother updated @ bedside  Level of care: Med-Surg Status is: Observation  The patient will require care spanning > 2 midnights and should be moved to inpatient  because: Inpatient level of care appropriate due to severity of illness  Dispo: The patient is from: Home              Anticipated d/c is to: Home              Patient currently is not medically stable to d/c.   Difficult to place patient No        Consultants:  urology  Procedures: none  Antimicrobials:  Ceftriaxone 6/28>    Subjective: This morning pain much improved. Nausea resolved, tolerated breakfast.  Objective: Vitals:   06/05/20 1912 06/05/20 2247 06/06/20 0017 06/06/20 0539  BP: (!) 136/93 116/61 115/64 115/66  Pulse: 96 94 92 96  Resp: 18 16 16 16   Temp: (!) 97.5 F (36.4 C) 98.1 F (36.7 C) 98.1 F (36.7 C) 98.4 F (36.9 C)  TempSrc: Oral  Oral Oral  SpO2: 99% 99% 100% 99%  Weight:      Height:        Intake/Output Summary (Last 24 hours) at 06/06/2020 0850 Last data filed at 06/06/2020 [id number] Gross per 24 hour  Intake 1100 ml  Output 525 ml  Net 575 ml   Filed Weights   06/05/20 1542  Weight: 79.4 kg    Examination:  General exam: Appears calm and comfortable  Respiratory system: Clear to auscultation. Respiratory effort normal. Cardiovascular system: S1 & S2 heard, RRR. No JVD, murmurs, rubs, gallops or clicks. No pedal edema. Gastrointestinal system: Abdomen is nondistended, soft and nontender. No organomegaly or masses felt. Normal bowel sounds heard. Mild right flank tenderness Central nervous system: Alert and oriented. No focal  neurological deficits. Extremities: Symmetric 5 x 5 power. Skin: No rashes, lesions or ulcers Psychiatry: Judgement and insight appear normal. Mood & affect appropriate.     Data Reviewed: I have personally reviewed following labs and imaging studies  CBC: Recent Labs  Lab 06/04/20 0902 06/05/20 1543 06/06/20 0502  WBC 9.8 16.2* 11.4*  HGB 13.8 14.3 11.5*  HCT 41.2 42.1 34.8*  MCV 87.3 87.2 89.7  PLT 361 294 207   Basic Metabolic Panel: Recent Labs  Lab 06/04/20 0902 06/05/20 1543  06/06/20 0502  NA 138 135 137  K 4.0 3.7 3.6  CL 105 102 109  CO2 23 23 23   GLUCOSE 135* 107* 102*  BUN 13 13 9   CREATININE 0.99 1.25* 1.06*  CALCIUM 8.8* 9.2 8.0*   GFR: Estimated Creatinine Clearance: 85.8 mL/min (A) (by C-G formula based on SCr of 1.06 mg/dL (H)). Liver Function Tests: No results for input(s): AST, ALT, ALKPHOS, BILITOT, PROT, ALBUMIN in the last 168 hours. No results for input(s): LIPASE, AMYLASE in the last 168 hours. No results for input(s): AMMONIA in the last 168 hours. Coagulation Profile: No results for input(s): INR, PROTIME in the last 168 hours. Cardiac Enzymes: No results for input(s): CKTOTAL, CKMB, CKMBINDEX, TROPONINI in the last 168 hours. BNP (last 3 results) No results for input(s): PROBNP in the last 8760 hours. HbA1C: No results for input(s): HGBA1C in the last 72 hours. CBG: No results for input(s): GLUCAP in the last 168 hours. Lipid Profile: No results for input(s): CHOL, HDL, LDLCALC, TRIG, CHOLHDL, LDLDIRECT in the last 72 hours. Thyroid Function Tests: No results for input(s): TSH, T4TOTAL, FREET4, T3FREE, THYROIDAB in the last 72 hours. Anemia Panel: No results for input(s): VITAMINB12, FOLATE, FERRITIN, TIBC, IRON, RETICCTPCT in the last 72 hours. Urine analysis:    Component Value Date/Time   COLORURINE YELLOW (A) 06/05/2020 1543   APPEARANCEUR HAZY (A) 06/05/2020 1543   LABSPEC 1.025 06/05/2020 1543   PHURINE 6.0 06/05/2020 1543   GLUCOSEU NEGATIVE 06/05/2020 1543   HGBUR LARGE (A) 06/05/2020 1543   BILIRUBINUR NEGATIVE 06/05/2020 1543   KETONESUR 20 (A) 06/05/2020 1543   PROTEINUR 30 (A) 06/05/2020 1543   NITRITE NEGATIVE 06/05/2020 1543   LEUKOCYTESUR MODERATE (A) 06/05/2020 1543   Sepsis Labs: @LABRCNTIP (procalcitonin:4,lacticidven:4)  ) Recent Results (from the past 240 hour(s))  SARS CORONAVIRUS 2 (TAT 6-24 HRS) Nasopharyngeal Nasopharyngeal Swab     Status: None   Collection Time: 06/05/20  8:04 PM    Specimen: Nasopharyngeal Swab  Result Value Ref Range Status   SARS Coronavirus 2 NEGATIVE NEGATIVE Final    Comment: (NOTE) SARS-CoV-2 target nucleic acids are NOT DETECTED.  The SARS-CoV-2 RNA is generally detectable in upper and lower respiratory specimens during the acute phase of infection. Negative results do not preclude SARS-CoV-2 infection, do not rule out co-infections with other pathogens, and should not be used as the sole basis for treatment or other patient management decisions. Negative results must be combined with clinical observations, patient history, and epidemiological information. The expected result is Negative.  Fact Sheet for Patients: 06/07/2020  Fact Sheet for Healthcare Providers:  This test is not yet approved or cleared by the 06/07/20 FDA and  has been authorized for detection and/or diagnosis of SARS-CoV-2 by FDA under an Emergency Use Authorization (EUA). This EUA will remain  in effect (meaning this test can be used) for the duration of the COVID-19 declaration under Se ction 564(b)(1) of the Act, 21 U.S.C. section 360bbb-3(b)(1),  unless the authorization is terminated or revoked sooner.  Performed at [facility], [address] [place], [place] [address]          Radiology Studies: CT Renal Stone Study  Result Date: 06/05/2020 CLINICAL DATA:  Urinary tract stone. EXAM: CT ABDOMEN AND PELVIS WITHOUT CONTRAST TECHNIQUE: Multidetector CT imaging of the abdomen and pelvis was performed following the standard protocol without IV contrast. COMPARISON:  Jun 04, 2020 FINDINGS: Lower chest: No acute abnormality. Hepatobiliary: No focal liver abnormality is seen. No gallstones, gallbladder wall thickening, or biliary dilatation. Pancreas: Unremarkable. No pancreatic ductal dilatation or surrounding inflammatory changes. Spleen: Normal in size without focal  abnormality. Adrenals/Urinary Tract: Normal adrenal glands. Moderate right hydronephrosis and hydroureter likely caused by 2 mm calculus at the right vesicoureteral junction. The calculus has slightly advanced in positioning from the prior study. Additional bilateral nonobstructive renal calculi, the largest in the lower pole of the left kidney measures 3.3 mm. Stomach/Bowel: Stomach is within normal limits. Appendix appears normal. No evidence of bowel wall thickening, distention, or inflammatory changes. Vascular/Lymphatic: No significant vascular findings are present. No enlarged abdominal or pelvic lymph nodes. Reproductive: Uterus and bilateral adnexa are unremarkable. Other: No abdominal wall hernia or abnormality. No abdominopelvic ascites. Musculoskeletal: No acute or significant osseous findings. IMPRESSION: 1. Moderate right hydronephrosis and hydroureter likely caused by 2 mm calculus at the right vesicoureteral junction. The calculus has slightly advanced in positioning from the prior study. Associated mild right perinephric stranding. 2. Additional bilateral nonobstructive renal calculi. Electronically Signed   By: Ted Mcalpine M.D.   On: 06/05/2020 19:06   CT Renal Stone Study  Result Date: 06/04/2020 CLINICAL DATA:  Flank pain.  Nausea and vomiting. EXAM: CT ABDOMEN AND PELVIS WITHOUT CONTRAST TECHNIQUE: Multidetector CT imaging of the abdomen and pelvis was performed following the standard protocol without IV contrast. COMPARISON:  None. FINDINGS: Lower chest: Insert lung bases Hepatobiliary: No focal liver abnormality is seen. No gallstones, gallbladder wall thickening, or biliary dilatation. Pancreas: Unremarkable. No pancreatic ductal dilatation or surrounding inflammatory changes. Spleen: Normal in size without focal abnormality. Adrenals/Urinary Tract: Adrenal glands are normal bilaterally. Five nonobstructing stones are present in the left kidney ranging from 2 mm to 4 mm. At least 3  small nonobstructing stones are present in the right kidney measuring up to 3 mm. Moderate right-sided hydronephrosis is present. There is some stranding about the right kidney. Right ureter is dilated to the level of a 3 mm obstructing stone just above the right ureterovesical junction. Urinary bladder is within normal limits. Stomach/Bowel: Stomach is within normal limits. Appendix appears normal. No evidence of bowel wall thickening, distention, or inflammatory changes. Vascular/Lymphatic: No significant vascular findings are present. No enlarged abdominal or pelvic lymph nodes. Reproductive: Uterus and bilateral adnexa are unremarkable. Other: Minimal free fluid within the anatomic pelvis is likely physiologic. No free fluid is present about the right kidney or ureter. No free air is present. No significant ventral hernia is present. Musculoskeletal: Vertebral body heights and alignment are normal. No focal lytic or blastic lesion is present. The bony pelvis is within normal limits. IMPRESSION: 1. Obstructing 3 mm stone just above the right ureterovesical junction with moderate right-sided hydronephrosis. 2. Multiple additional nonobstructing stones in both kidneys. Electronically Signed   By: Marin Roberts M.D.   On: 06/04/2020 10:43        Scheduled Meds: . tamsulosin  0.4 mg Oral QPC breakfast   Continuous Infusions: . sodium chloride 125  mL/hr at 06/06/20 0610  . cefTRIAXone (ROCEPHIN)  IV       LOS: 0 days    Time spent: 46    Silvano Bilis, MD [facility]   If 7PM-7AM, please contact night-coverage [email or web address] Password [email or web address] 06/06/2020, 8:50 AM

## 2020-06-06 NOTE — Plan of Care (Signed)

## 2020-06-06 NOTE — Accreditation Note (Signed)
Notified family of patient status. Pt slept all night. No stone has been strained from the urine yet. And pt is just now reporting discomfort.

## 2020-06-06 NOTE — Progress Notes (Signed)
I spoke with the patient and her mother  She has not passed the stone but pain is under control. Labs look better today with wbc 11.4. she has been AFVSS.  I presented the option of going ahead to the OR vs another day of observation. She desires observation. I told her if she gets a fever or pain gets uncontrolled, we can proceed to the OR. I will also make her npo at [facility] for possible intervention in the morning. OK to eat this morning

## 2020-06-07 ENCOUNTER — Encounter: Payer: Self-pay | Admitting: Obstetrics and Gynecology

## 2020-06-07 ENCOUNTER — Encounter
Admission: EM | Disposition: A | Discharge status: Home / Self Care | Payer: Self-pay | Source: Home / Self Care | Attending: Obstetrics and Gynecology

## 2020-06-07 ENCOUNTER — Inpatient Hospital Stay: Payer: BC Managed Care – PPO | Admitting: Anesthesiology

## 2020-06-07 HISTORY — PX: CYSTOSCOPY WITH STENT PLACEMENT: SHX5790

## 2020-06-07 HISTORY — PX: URETEROSCOPY: SHX842

## 2020-06-07 LAB — CBC
HCT: 34.8 % — ABNORMAL LOW (ref 36.0–46.0)
Hemoglobin: 11.5 g/dL — ABNORMAL LOW (ref 12.0–15.0)
MCH: 29.6 pg (ref 26.0–34.0)
MCHC: 33 g/dL (ref 30.0–36.0)
MCV: 89.5 fL (ref 80.0–100.0)
Platelets: 170 10*3/uL (ref 150–400)
RBC: 3.89 MIL/uL (ref 3.87–5.11)
RDW: 12.2 % (ref 11.5–15.5)
WBC: 9.7 10*3/uL (ref 4.0–10.5)
nRBC: 0 % (ref 0.0–0.2)

## 2020-06-07 LAB — BASIC METABOLIC PANEL
Anion gap: 11 (ref 5–15)
BUN: 8 mg/dL (ref 6–20)
CO2: 18 mmol/L — ABNORMAL LOW (ref 22–32)
Calcium: 8.3 mg/dL — ABNORMAL LOW (ref 8.9–10.3)
Chloride: 112 mmol/L — ABNORMAL HIGH (ref 98–111)
Creatinine, Ser: 0.8 mg/dL (ref 0.44–1.00)
GFR, Estimated: >60 mL/min (ref >60)
Glucose, Bld: 98 mg/dL (ref 70–99)
Potassium: 3.9 mmol/L (ref 3.5–5.1)
Sodium: 141 mmol/L (ref 135–145)

## 2020-06-07 LAB — URINE CULTURE

## 2020-06-07 SURGERY — URETEROSCOPY
Anesthesia: General | Site: Ureter | Laterality: Right

## 2020-06-07 MED ORDER — OXYCODONE HCL 5 MG PO TABS
5.0000 mg | ORAL_TABLET | Freq: Once | ORAL | Status: DC | PRN
Start: 2020-06-07 — End: 2020-06-07

## 2020-06-07 MED ORDER — ONDANSETRON HCL 4 MG/2ML IJ SOLN: 4.0000 mg | Freq: Once | INTRAMUSCULAR | Status: DC | PRN

## 2020-06-07 MED ORDER — CEPHALEXIN 250 MG PO CAPS: 250.0000 mg | ORAL_CAPSULE | Freq: Four times a day (QID) | ORAL | 0 refills | Status: AC

## 2020-06-07 MED ORDER — PROPOFOL 500 MG/50ML IV EMUL
INTRAVENOUS | Status: DC | PRN
  Administered 2020-06-07: 150 ug/kg/min via INTRAVENOUS

## 2020-06-07 MED ORDER — MIDAZOLAM HCL 2 MG/2ML IJ SOLN
INTRAMUSCULAR | Status: AC
  Filled 2020-06-07: qty 2

## 2020-06-07 MED ORDER — DEXAMETHASONE SODIUM PHOSPHATE 10 MG/ML IJ SOLN
INTRAMUSCULAR | Status: DC | PRN
  Administered 2020-06-07: 10 mg via INTRAVENOUS

## 2020-06-07 MED ORDER — FENTANYL CITRATE (PF) 100 MCG/2ML IJ SOLN
INTRAMUSCULAR | Status: DC | PRN
  Administered 2020-06-07 (×4): 25 ug via INTRAVENOUS

## 2020-06-07 MED ORDER — LIDOCAINE HCL (CARDIAC) PF 100 MG/5ML IV SOSY
PREFILLED_SYRINGE | INTRAVENOUS | Status: DC | PRN
  Administered 2020-06-07: 80 mg via INTRAVENOUS

## 2020-06-07 MED ORDER — ONDANSETRON HCL 4 MG/2ML IJ SOLN
INTRAMUSCULAR | Status: DC | PRN
  Administered 2020-06-07: 4 mg via INTRAVENOUS

## 2020-06-07 MED ORDER — IOHEXOL 180 MG/ML  SOLN
INTRAMUSCULAR | Status: DC | PRN
  Administered 2020-06-07: 10 mL

## 2020-06-07 MED ORDER — ACETAMINOPHEN 10 MG/ML IV SOLN: 1000.0000 mg | Freq: Once | INTRAVENOUS | Status: DC | PRN

## 2020-06-07 MED ORDER — PROPOFOL 10 MG/ML IV BOLUS
INTRAVENOUS | Status: DC | PRN
  Administered 2020-06-07: 150 mg via INTRAVENOUS

## 2020-06-07 MED ORDER — LACTATED RINGERS IV SOLN: INTRAVENOUS | Status: DC | PRN

## 2020-06-07 MED ORDER — FENTANYL CITRATE (PF) 100 MCG/2ML IJ SOLN
INTRAMUSCULAR | Status: AC
  Filled 2020-06-07: qty 2

## 2020-06-07 MED ORDER — OXYCODONE HCL 5 MG/5ML PO SOLN: 5.0000 mg | Freq: Once | ORAL | Status: DC | PRN

## 2020-06-07 MED ORDER — MIDAZOLAM HCL 2 MG/2ML IJ SOLN
INTRAMUSCULAR | Status: DC | PRN
  Administered 2020-06-07: 2 mg via INTRAVENOUS

## 2020-06-07 MED ORDER — FENTANYL CITRATE (PF) 100 MCG/2ML IJ SOLN: 25.0000 ug | INTRAMUSCULAR | Status: DC | PRN

## 2020-06-07 SURGICAL SUPPLY — 17 items
BAG DRAIN CYSTO-URO LG1000N (MISCELLANEOUS) ×6 IMPLANT
BRUSH SCRUB EZ  4% CHG (MISCELLANEOUS) ×1
BRUSH SCRUB EZ 4% CHG (MISCELLANEOUS) ×2 IMPLANT
CATH URETL 5X70 OPEN END (CATHETERS) ×3 IMPLANT
CONRAY 43 FOR UROLOGY 50M (MISCELLANEOUS) ×3 IMPLANT
DRAPE UTILITY 15X26 TOWEL STRL (DRAPES) ×3 IMPLANT
GOWN STRL REUS W/ TWL LRG LVL3 (GOWN DISPOSABLE) ×2 IMPLANT
GOWN STRL REUS W/TWL LRG LVL3 (GOWN DISPOSABLE) ×1
GUIDEWIRE STR DUAL SENSOR (WIRE) ×3 IMPLANT
IV NS IRRIG 3000ML ARTHROMATIC (IV SOLUTION) ×3 IMPLANT
KIT TURNOVER CYSTO (KITS) ×3 IMPLANT
MANIFOLD NEPTUNE II (INSTRUMENTS) ×3 IMPLANT
PACK CYSTO AR (MISCELLANEOUS) ×3 IMPLANT
SET CYSTO W/LG BORE CLAMP LF (SET/KITS/TRAYS/PACK) ×3 IMPLANT
STENT URETL FIRM 6X24 (STENTS) ×1 IMPLANT
SURGILUBE 2OZ TUBE FLIPTOP (MISCELLANEOUS) ×3 IMPLANT
WATER STERILE IRR 1000ML POUR (IV SOLUTION) ×3 IMPLANT

## 2020-06-07 NOTE — Anesthesia Preprocedure Evaluation (Signed)
Anesthesia Evaluation  Patient identified by MRN, date of birth, ID band Patient awake    Reviewed: Allergy & Precautions, NPO status , Patient's Chart, lab work & pertinent test results  History of Anesthesia Complications (+) PONV and history of anesthetic complications  Airway Mallampati: II  TM Distance: >3 FB Neck ROM: Full    Dental no notable dental hx. (+) Teeth Intact   Pulmonary neg pulmonary ROS, neg sleep apnea, neg COPD, Patient abstained from smoking.Not current smoker,    Pulmonary exam normal breath sounds clear to auscultation       Cardiovascular Exercise Tolerance: Good METS(-) hypertension(-) CAD and (-) Past MI negative cardio ROS  (-) dysrhythmias  Rhythm:Regular Rate:Normal - Systolic murmurs    Neuro/Psych  Headaches, negative psych ROS   GI/Hepatic neg GERD  ,(+)     (-) substance abuse  ,   Endo/Other  neg diabetes  Renal/GU negative Renal ROS     Musculoskeletal   Abdominal   Peds  Hematology   Anesthesia Other Findings Past Medical History: No date: Bronchitis No date: Family history of ovarian cancer     Comment:  5/22 cancer genetic testing letter sent No date: Headache     Comment:  migraines No date: Kidney stones  Reproductive/Obstetrics                             Anesthesia Physical Anesthesia Plan  ASA: II  Anesthesia Plan: General   Post-op Pain Management:    Induction: Intravenous  PONV Risk Score and Plan: 4 or greater and Ondansetron, Dexamethasone, TIVA and Propofol infusion  Airway Management Planned: LMA  Additional Equipment: None  Intra-op Plan:   Post-operative Plan: Extubation in OR  Informed Consent: I have reviewed the patients History and Physical, chart, labs and discussed the procedure including the risks, benefits and alternatives for the proposed anesthesia with the patient or authorized representative who has  indicated his/her understanding and acceptance.     Dental advisory given  Plan Discussed with: CRNA and Surgeon  Anesthesia Plan Comments: (Discussed risks of anesthesia with patient, including PONV, sore throat, lip/dental damage. Rare risks discussed as well, such as cardiorespiratory and neurological sequelae. Patient understands.)        Anesthesia Quick Evaluation

## 2020-06-07 NOTE — Op Note (Signed)
Operative Note  Preoperative diagnosis:  1.  Right ureteral calculus  Postoperative diagnosis: 1.  Right ureteral calculus that had passed  Procedure(s): 1.  Cystoscopy with right retrograde pyelogram, right diagnostic ureteroscopy, right ureteral stent placement  Surgeon: Modena Slater, MD  Assistants: None  Anesthesia: General  Complications: None immediate  EBL: Minimal  Specimens: 1.  None  Drains/Catheters: 1.  6 x 24 double-J ureteral stent, no string  Intraoperative findings: 1.  Normal urethra and bladder 2.  Ureteral edema and narrowing at the ureterovesicular junction.  Ureteroscopy up to the renal pelvis revealed no stone.  Retrograde pyelogram revealed no hydronephrosis.  No obvious filling defects in the ureter or kidney  Indication: 25 year old female presented with right-sided flank pain and a distal ureteral calculus.  She has had some intermittent tachycardia.  She was admitted for observation.  She was placed on antibiotics and leukocytosis improved as well as creatinine.  She was straining her urine but had not passed a stone and had pain up to 7 out of 10 overnight.  She was requiring pain medication.  The decision was made to proceed with the above operation.  Description of procedure:  The patient was identified and consent was obtained.  The patient was taken to the operating room and placed in the supine position.  The patient was placed under general anesthesia.  Perioperative antibiotics were administered.  The patient was placed in dorsal lithotomy.  Patient was prepped and draped in a standard sterile fashion and a timeout was performed.  A 21 French rigid cystoscope was advanced into the urethra and into the bladder.  Complete cystoscopy was performed with no abnormal findings.  The right ureter was cannulated with a sensor wire which was advanced up to the kidney under fluoroscopic guidance.  Semirigid ureteroscopy was performed up to the level of the  renal pelvis.  There was significant ureteral edema but I was able to navigate the scope into the ureter.  There was minimal trauma but again I noted significant edema and narrowing.  There was no stone up to the level of the renal pelvis.  I shot a retrograde pyelogram through the scope with findings noted above.  I withdrew the scope and backloaded the wire onto the rigid cystoscope and advanced that into the bladder followed by routine placement of a 6 x 24 double-J ureteral stent.  Fluoroscopy confirmed proximal placement and direct visualization confirmed a good coil within the bladder.  I drained the bladder and withdrew the scope.  Patient tolerated the procedure well and was stable postoperative.  Plan: Sent home with 7 days of Keflex.  She can likely go home this afternoon versus in the morning if doing well clinically.  I will send a message to the office to arrange for follow-up.

## 2020-06-07 NOTE — Interval H&P Note (Signed)
History and Physical Interval Note:  06/07/2020 9:42 AM  Grace Mendoza  has presented today for surgery, with the diagnosis of Right ureteral stone.  The various methods of treatment have been discussed with the patient and family. After consideration of risks, benefits and other options for treatment, the patient has consented to  Procedure(s): URETEROSCOPY (Right) CYSTOSCOPY WITH STENT PLACEMENT (Right) as a surgical intervention.  The patient's history has been reviewed, patient examined, no change in status, stable for surgery.  I have reviewed the patient's chart and labs.  Questions were answered to the patient's satisfaction.     Ray Church, III

## 2020-06-07 NOTE — Anesthesia Procedure Notes (Signed)
Procedure Name: LMA Insertion Date/Time: 06/07/2020 10:12 AM Performed by: Joanette Gula, Rhealynn Myhre, CRNA Pre-anesthesia Checklist: Patient identified, Emergency Drugs available, Suction available and Patient being monitored Patient Re-evaluated:Patient Re-evaluated prior to induction Oxygen Delivery Method: Circle system utilized Preoxygenation: Pre-oxygenation with 100% oxygen Induction Type: IV induction Ventilation: Mask ventilation without difficulty LMA: LMA inserted LMA Size: 3.0 Number of attempts: 1 Placement Confirmation: positive ETCO2 and breath sounds checked- equal and bilateral Tube secured with: Tape Dental Injury: Teeth and Oropharynx as per pre-operative assessment

## 2020-06-07 NOTE — H&P (View-Only) (Signed)
I am asked by the charge OR nurse to "put in a note as to why this case is an emergency."   The patient has an obstructing stone. I cave tried conservative measures of trial passage. There is also concern for infection. Her HR has been borderline elevated and sustained in the 110s last night. Her pain got up to 7 and is not able to be managed without pain medication. It is not appropriate to wait another day. 

## 2020-06-07 NOTE — Progress Notes (Signed)
No evidence of kidney stone in urine strainer this shift.  Heart rate elevated yesterday evening, heart rate is currently in the lower 80's.

## 2020-06-07 NOTE — Transfer of Care (Signed)
Immediate Anesthesia Transfer of Care Note  Patient: Grace Mendoza  Procedure(s) Performed: DIAGNOSTIC URETEROSCOPY (Right Ureter) CYSTOSCOPY WITH STENT PLACEMENT (Right )  Patient Location: PACU  Anesthesia Type:General  Level of Consciousness: awake, alert  and oriented  Airway & Oxygen Therapy: Patient Spontanous Breathing and Patient connected to face mask oxygen  Post-op Assessment: Report given to RN and Post -op Vital signs reviewed and stable  Post vital signs: Reviewed and stable  Last Vitals:  Vitals Value Taken Time  BP 103/65   Temp    Pulse 92   Resp 8   SpO2 100     Last Pain:  Vitals:   06/07/20 0802  TempSrc: Oral  PainSc:          Complications: No complications documented.

## 2020-06-07 NOTE — Discharge Instructions (Signed)

## 2020-06-07 NOTE — Discharge Summary (Signed)
Grace Mendoza ZDG:[id number] DOB: 1995/08/07 DOA: 06/05/2020  PCP: Erick Colace, MD  Admit date: 06/05/2020 Discharge date: 06/07/2020  Time spent: 35 minutes  Recommendations for Outpatient Follow-up:  1. Urology follow-up as scheduled     Discharge Diagnoses:  Active Problems:   Acute pyelonephritis   Hydronephrosis with renal and ureteral calculus obstruction   Pyelonephritis   Discharge Condition: stable  Diet recommendation: regular  Filed Weights   06/05/20 1542  Weight: 79.4 kg    History of present illness:  Grace Mendoza a 24 y.o.femalePreviously with no significant medical history, seen and treated in the emergency room the day prior for renal colic secondary to 3 mm distal ureterolithiasis with hydronephrosis, discharged on oxycodone and Zofran who presents again to the emergency room with continued right flank pain with protracted nausea and vomiting, unable to keep anything down. She denies fever or chills, chest pain or shortness of breath or cough and denies change in bowel habits. ED course: On arrival, afebrile with BP 104/63, pulse 98 and O2 sat 100% on room air blood work significant for leukocytosis of 16,000 up from 9000 the day prior, with few bacteria on urinalysis compared to rare bacteria the day prior    Hospital Course:  Patient presented with renal colic, found to have obstructing right side 11mm stone complicated by pyelonephritis. Treated with ceftriaxone and infectious symptoms improved. Cystoscopy performed 5/30 with stent placement. No stone encountered, must have passed. Urine culture showing mixed species. Plan for discharge home with 1-week course keflex, outpt f/u with urology for stent removal. Careful infection return precautions reviewed.    Procedures:  Cystoscopy with right ureteral stent placement  Consultations:  urology  Discharge Exam: Vitals:   06/07/20 1115 06/07/20 1150  BP: 114/67 (!) 94/52  Pulse: 83 68   Resp: 18 17  Temp:  98.1 F (36.7 C)  SpO2: 100% 100%    General exam: Appears calm and comfortable  Respiratory system: Clear to auscultation. Respiratory effort normal. Cardiovascular system: S1 & S2 heard, RRR. No JVD, murmurs, rubs, gallops or clicks. No pedal edema. Gastrointestinal system: Abdomen is nondistended, soft and nontender. No organomegaly or masses felt. Normal bowel sounds heard. Mild right flank tenderness Central nervous system: Alert and oriented. No focal neurological deficits. Extremities: Symmetric 5 x 5 power. Skin: No rashes, lesions or ulcers Psychiatry: Judgement and insight appear normal. Mood & affect appropriate.   Discharge Instructions   Discharge Instructions    Diet general   Complete by: As directed    Increase activity slowly   Complete by: As directed      Allergies as of 06/07/2020      Reactions   Bee Venom       Medication List    STOP taking these medications   diphenhydrAMINE 25 MG tablet Commonly known as: BENADRYL   ondansetron 4 MG disintegrating tablet Commonly known as: Zofran ODT   oxyCODONE-acetaminophen 5-325 MG tablet Commonly known as: Percocet     TAKE these medications   cephALEXin 250 MG capsule Commonly known as: KEFLEX Take 1 capsule (250 mg total) by mouth 4 (four) times daily for 7 days.   Nikki 3-0.02 MG tablet Generic drug: drospirenone-ethinyl estradiol Take 1 tablet by mouth daily.      Allergies  Allergen Reactions  . Bee Venom       The results of significant diagnostics from this hospitalization (including imaging, microbiology, ancillary and laboratory) are listed below for reference.    Significant  Diagnostic Studies: DG OR UROLOGY CYSTO IMAGE ([facility] ONLY)  Result Date: 06/07/2020 There is no interpretation for this exam.  This order is for images obtained during a surgical procedure.  Please See "Surgeries" Tab for more information regarding the procedure.   CT Renal Stone  Study  Result Date: 06/05/2020 CLINICAL DATA:  Urinary tract stone. EXAM: CT ABDOMEN AND PELVIS WITHOUT CONTRAST TECHNIQUE: Multidetector CT imaging of the abdomen and pelvis was performed following the standard protocol without IV contrast. COMPARISON:  Jun 04, 2020 FINDINGS: Lower chest: No acute abnormality. Hepatobiliary: No focal liver abnormality is seen. No gallstones, gallbladder wall thickening, or biliary dilatation. Pancreas: Unremarkable. No pancreatic ductal dilatation or surrounding inflammatory changes. Spleen: Normal in size without focal abnormality. Adrenals/Urinary Tract: Normal adrenal glands. Moderate right hydronephrosis and hydroureter likely caused by 2 mm calculus at the right vesicoureteral junction. The calculus has slightly advanced in positioning from the prior study. Additional bilateral nonobstructive renal calculi, the largest in the lower pole of the left kidney measures 3.3 mm. Stomach/Bowel: Stomach is within normal limits. Appendix appears normal. No evidence of bowel wall thickening, distention, or inflammatory changes. Vascular/Lymphatic: No significant vascular findings are present. No enlarged abdominal or pelvic lymph nodes. Reproductive: Uterus and bilateral adnexa are unremarkable. Other: No abdominal wall hernia or abnormality. No abdominopelvic ascites. Musculoskeletal: No acute or significant osseous findings. IMPRESSION: 1. Moderate right hydronephrosis and hydroureter likely caused by 2 mm calculus at the right vesicoureteral junction. The calculus has slightly advanced in positioning from the prior study. Associated mild right perinephric stranding. 2. Additional bilateral nonobstructive renal calculi. Electronically Signed   By: Ted Mcalpine M.D.   On: 06/05/2020 19:06   CT Renal Stone Study  Result Date: 06/04/2020 CLINICAL DATA:  Flank pain.  Nausea and vomiting. EXAM: CT ABDOMEN AND PELVIS WITHOUT CONTRAST TECHNIQUE: Multidetector CT imaging of the  abdomen and pelvis was performed following the standard protocol without IV contrast. COMPARISON:  None. FINDINGS: Lower chest: Insert lung bases Hepatobiliary: No focal liver abnormality is seen. No gallstones, gallbladder wall thickening, or biliary dilatation. Pancreas: Unremarkable. No pancreatic ductal dilatation or surrounding inflammatory changes. Spleen: Normal in size without focal abnormality. Adrenals/Urinary Tract: Adrenal glands are normal bilaterally. Five nonobstructing stones are present in the left kidney ranging from 2 mm to 4 mm. At least 3 small nonobstructing stones are present in the right kidney measuring up to 3 mm. Moderate right-sided hydronephrosis is present. There is some stranding about the right kidney. Right ureter is dilated to the level of a 3 mm obstructing stone just above the right ureterovesical junction. Urinary bladder is within normal limits. Stomach/Bowel: Stomach is within normal limits. Appendix appears normal. No evidence of bowel wall thickening, distention, or inflammatory changes. Vascular/Lymphatic: No significant vascular findings are present. No enlarged abdominal or pelvic lymph nodes. Reproductive: Uterus and bilateral adnexa are unremarkable. Other: Minimal free fluid within the anatomic pelvis is likely physiologic. No free fluid is present about the right kidney or ureter. No free air is present. No significant ventral hernia is present. Musculoskeletal: Vertebral body heights and alignment are normal. No focal lytic or blastic lesion is present. The bony pelvis is within normal limits. IMPRESSION: 1. Obstructing 3 mm stone just above the right ureterovesical junction with moderate right-sided hydronephrosis. 2. Multiple additional nonobstructing stones in both kidneys. Electronically Signed   By: Marin Roberts M.D.   On: 06/04/2020 10:43    Microbiology: Recent Results (from the past 240 hour(s))  Urine culture  Status: Abnormal   Collection  Time: 06/05/20  3:43 PM   Specimen: Urine, Random  Result Value Ref Range Status   Specimen Description   Final    URINE, RANDOM Performed at [facility] [address] [place], [place] [address]    Special Requests   Final    NONE Performed at [facility], [address] [place], [place] [address]    Culture MULTIPLE SPECIES PRESENT, SUGGEST RECOLLECTION (A)  Final   Report Status 06/07/2020 FINAL  Final  SARS CORONAVIRUS 2 (TAT 6-24 HRS) Nasopharyngeal Nasopharyngeal Swab     Status: None   Collection Time: 06/05/20  8:04 PM   Specimen: Nasopharyngeal Swab  Result Value Ref Range Status   SARS Coronavirus 2 NEGATIVE NEGATIVE Final    Comment: (NOTE) SARS-CoV-2 target nucleic acids are NOT DETECTED.  The SARS-CoV-2 RNA is generally detectable in upper and lower respiratory specimens during the acute phase of infection. Negative results do not preclude SARS-CoV-2 infection, do not rule out co-infections with other pathogens, and should not be used as the sole basis for treatment or other patient management decisions. Negative results must be combined with clinical observations, patient history, and epidemiological information. The expected result is Negative.  Fact Sheet for Patients: HairSlick.no  Fact Sheet for Healthcare Providers: quierodirigir.com  This test is not yet approved or cleared by the [place] FDA and  has been authorized for detection and/or diagnosis of SARS-CoV-2 by FDA under an Emergency Use Authorization (EUA). This EUA will remain  in effect (meaning this test can be used) for the duration of the COVID-19 declaration under Se ction 564(b)(1) of the Act, 21 U.S.C. section 360bbb-3(b)(1), unless the authorization is terminated or revoked sooner.  Performed at [facility], [address], [place], [place] [address]      Labs: Basic Metabolic Panel: Recent  Labs  Lab 06/04/20 0902 06/05/20 1543 06/06/20 0502 06/07/20 0539  NA 138 135 137 141  K 4.0 3.7 3.6 3.9  CL 105 102 109 112*  CO2 23 23 23  18*  GLUCOSE 135* 107* 102* 98  BUN 13 13 9 8   CREATININE 0.99 1.25* 1.06* 0.80  CALCIUM 8.8* 9.2 8.0* 8.3*   Liver Function Tests: No results for input(s): AST, ALT, ALKPHOS, BILITOT, PROT, ALBUMIN in the last 168 hours. No results for input(s): LIPASE, AMYLASE in the last 168 hours. No results for input(s): AMMONIA in the last 168 hours. CBC: Recent Labs  Lab 06/04/20 0902 06/05/20 1543 06/06/20 0502 06/07/20 0743  WBC 9.8 16.2* 11.4* 9.7  HGB 13.8 14.3 11.5* 11.5*  HCT 41.2 42.1 34.8* 34.8*  MCV 87.3 87.2 89.7 89.5  PLT 361 294 207 170   Cardiac Enzymes: No results for input(s): CKTOTAL, CKMB, CKMBINDEX, TROPONINI in the last 168 hours. BNP: BNP (last 3 results) No results for input(s): BNP in the last 8760 hours.  ProBNP (last 3 results) No results for input(s): PROBNP in the last 8760 hours.  CBG: No results for input(s): GLUCAP in the last 168 hours.     Signed:  06/08/20 MD.  [facility] 06/07/2020, 12:50 PM

## 2020-06-07 NOTE — Progress Notes (Signed)
I am asked by the charge OR nurse to "put in a note as to why this case is an emergency."   The patient has an obstructing stone. I cave tried conservative measures of trial passage. There is also concern for infection. Her HR has been borderline elevated and sustained in the 110s last night. Her pain got up to 7 and is not able to be managed without pain medication. It is not appropriate to wait another day.

## 2020-06-07 NOTE — Anesthesia Postprocedure Evaluation (Signed)
Anesthesia Post Note  Patient: Grace Mendoza  Procedure(s) Performed: DIAGNOSTIC URETEROSCOPY (Right Ureter) CYSTOSCOPY WITH STENT PLACEMENT (Right )  Patient location during evaluation: PACU Anesthesia Type: General Level of consciousness: awake and alert Pain management: pain level controlled Vital Signs Assessment: post-procedure vital signs reviewed and stable Respiratory status: spontaneous breathing, nonlabored ventilation, respiratory function stable and patient connected to nasal cannula oxygen Cardiovascular status: blood pressure returned to baseline and stable Postop Assessment: no apparent nausea or vomiting Anesthetic complications: no   No complications documented.   Last Vitals:  Vitals:   06/07/20 0802 06/07/20 1045  BP: 123/71 103/65  Pulse: 96 92  Resp: 17 11  Temp: 36.8 C 36.4 C  SpO2: 100% 100%    Last Pain:  Vitals:   06/07/20 1045  TempSrc:   PainSc: 0-No pain                 Corinda Gubler

## 2020-06-08 ENCOUNTER — Inpatient Hospital Stay: Payer: BC Managed Care – PPO

## 2020-06-08 ENCOUNTER — Encounter: Payer: Self-pay | Admitting: Urology

## 2020-06-15 ENCOUNTER — Encounter: Payer: Self-pay | Admitting: Urology

## 2020-06-15 ENCOUNTER — Telehealth: Payer: Self-pay | Admitting: Urology

## 2020-06-15 ENCOUNTER — Other Ambulatory Visit: Payer: Self-pay

## 2020-06-15 ENCOUNTER — Ambulatory Visit (INDEPENDENT_AMBULATORY_CARE_PROVIDER_SITE_OTHER): Payer: BC Managed Care – PPO | Admitting: Urology

## 2020-06-15 VITALS — BP 110/84 | HR 132 | Ht 64.5 in | Wt 174.0 lb

## 2020-06-15 DIAGNOSIS — N132 Hydronephrosis with renal and ureteral calculous obstruction: Secondary | ICD-10-CM

## 2020-06-15 MED ORDER — SULFAMETHOXAZOLE-TRIMETHOPRIM 800-160 MG PO TABS
1.0000 | ORAL_TABLET | Freq: Once | ORAL | Status: AC
  Administered 2020-06-15: 1 via ORAL

## 2020-06-15 NOTE — Telephone Encounter (Signed)
Sent return to work note through [employer] aware

## 2020-06-15 NOTE — Telephone Encounter (Signed)
pt states it was discussed during her visit for a work note to start on the day of surgery thru the day stent was removed, pt asks that it be sent to her email or Mychart. Please advise.

## 2020-06-15 NOTE — Progress Notes (Signed)
   06/15/20  CC:  Chief Complaint  Patient presents with  . Cysto Stent Removal    HPI: 25 year old female with a personal history of left ureteral calculus status post ureteroscopy found to have ureteral edema status post stent placement returns today for cystoscopy stent removal.  Records from Dr. Shannan Harper surgery were reviewed today.  She has been tolerating the stent fairly well.  She is very anxious about having her stent removed today.  No signs or symptoms of infection.  Blood pressure 110/84, pulse (!) 132, height 5' 4.5" (1.638 m), weight 174 lb (78.9 kg), last menstrual period 06/01/2020. NED. A&Ox3.   No respiratory distress   Abd soft, NT, ND Normal external genitalia with patent urethral meatus  She was administered periprocedural antibiotics today.  Cystoscopy/ Stent removal procedure  Patient identification was confirmed, informed consent was obtained, and patient was prepped using Betadine solution.  Lidocaine jelly was administered per urethral meatus.    Preoperative abx where received prior to procedure.    Procedure: - Flexible cystoscope introduced, without any difficulty.   - Thorough search of the bladder revealed:    normal urethral meatus  Stent seen emanating from left ureteral orifice, grasped with stent graspers, and removed in entirety.    Post-Procedure: - Patient tolerated the procedure well   Assessment/ Plan:  1. Hydronephrosis with renal and ureteral calculus obstruction Status post diagnostic ureteroscopy with stent placement  Stent removed successfully today requested by Dr. Alvester Morin which was moderately well-tolerated  We discussed warning symptoms post procedurally and indications for more urgent/emergent evaluation  We will have her follow-up in 4 weeks with renal ultrasound prior to assess for resolution of her hydronephrosis - sulfamethoxazole-trimethoprim (BACTRIM DS) 800-160 MG per tablet 1 tablet - Ultrasound renal complete;  Future   Vanna Scotland, MD

## 2020-07-19 ENCOUNTER — Ambulatory Visit
Admission: RE | Admit: 2020-07-19 | Discharge: 2020-07-19 | Disposition: A | Discharge status: Home / Self Care | Payer: BC Managed Care – PPO | Source: Ambulatory Visit | Attending: Urology | Admitting: Urology

## 2020-07-19 ENCOUNTER — Other Ambulatory Visit: Payer: Self-pay

## 2020-07-19 DIAGNOSIS — N132 Hydronephrosis with renal and ureteral calculous obstruction: Secondary | ICD-10-CM

## 2020-07-20 ENCOUNTER — Ambulatory Visit: Payer: BC Managed Care – PPO | Admitting: Urology

## 2020-07-20 ENCOUNTER — Encounter: Payer: Self-pay | Admitting: Urology

## 2020-07-20 VITALS — BP 114/79 | HR 106 | Ht 64.5 in | Wt 174.0 lb

## 2020-07-20 DIAGNOSIS — N200 Calculus of kidney: Secondary | ICD-10-CM

## 2020-07-20 NOTE — Progress Notes (Signed)
07/20/2020 8:49 AM   Grace Mendoza 11-28-1995 [id number]  Referring provider: Erick Colace, MD [address] [place],  [place] [address]  Chief Complaint  Patient presents with   Nephrolithiasis     HPI: 25 year old female initially presenting with a right ureteral stone who underwent diagnostic ureteroscopy followed by subsequent stent removal.  Please see previous notes for details.  Notably, intraoperatively, she is known to have some right ureteral edema the stone was not identified.  She returned to the office for stent removal.  She follows up today with renal ultrasound which shows complete resolution of her hydroureteronephrosis as well as no residual stone burden.  She overall is doing well.  She had some intermittent flank and bladder pain for a day or 2 after the stent came out but then now is returned completely back to baseline  No previous stone history.  She has not been really good about drinking water until the stone event.  Notably, she does have some small nonobstructing stones in her left kidney up to 3 mm.  Accompanied by mother today.   PMH: Past Medical History:  Diagnosis Date   Bronchitis    Family history of ovarian cancer    5/22 cancer genetic testing letter sent   Headache    migraines   Kidney stones     Surgical History: Past Surgical History:  Procedure Laterality Date   CYSTOSCOPY WITH STENT PLACEMENT Right 06/07/2020   Procedure: CYSTOSCOPY WITH STENT PLACEMENT;  Surgeon: Crista Elliot, MD;  Location: [facility] ORS;  Service: Urology;  Laterality: Right;   PILONIDAL CYST EXCISION N/A 12/05/2017   Procedure: CYST EXCISION PILONIDAL EXTENSIVE;  Surgeon: Earline Mayotte, MD;  Location: [facility] ORS;  Service: General;  Laterality: N/A;   TONSILLECTOMY  2004   URETEROSCOPY Right 06/07/2020   Procedure: DIAGNOSTIC URETEROSCOPY;  Surgeon: Crista Elliot, MD;  Location: [facility] ORS;  Service: Urology;  Laterality: Right;    Home  Medications:  Allergies as of 07/20/2020       Reactions   Bee Venom         Medication List        Accurate as of July 20, 2020  8:49 AM. If you have any questions, ask your nurse or doctor.          Nikki 3-0.02 MG tablet Generic drug: drospirenone-ethinyl estradiol Take 1 tablet by mouth daily.        Allergies:  Allergies  Allergen Reactions   Bee Venom     Family History: Family History  Problem Relation Age of Onset   Ovarian cancer Paternal Aunt 72    Social History:  reports that she has never smoked. She has never used smokeless tobacco. She reports that she does not drink alcohol and does not use drugs.   Physical Exam: BP 114/79   Pulse (!) 106   Ht 5' 4.5" (1.638 m)   Wt 174 lb (78.9 kg)   BMI 29.41 kg/m   Constitutional:  Alert and oriented, No acute distress. HEENT: [place] AT, moist mucus membranes.  Trachea midline, no masses. Cardiovascular: No clubbing, cyanosis, or edema. Respiratory: Normal respiratory effort, no increased work of breathing. Skin: No rashes, bruises or suspicious lesions. Neurologic: Grossly intact, no focal deficits, moving all 4 extremities. Psychiatric: Normal mood and affect.  Pertinent Imaging: Personally reviewed renal ultrasound that was completed yesterday.  The radiologic interpretation is still pending.  There is no evidence of residual hydronephrosis bilaterally  and no significant stone burden.  I did also review her previous CT scan which does show punctate stones, left greater than right up to 3 mm primarily in the lower pole   Assessment & Plan:    1. Kidney stones Status post uncomplicated ureteroscopy with stent removal  No residual hydronephrosis  We discussed her residual stone burden, would recommend observation at this time  We discussed general stone prevention techniques including drinking plenty water with goal of producing 2.5 L urine daily, increased citric acid intake, avoidance of high  oxalate containing foods, and decreased salt intake.  Information about dietary recommendations given today.  We will plan to see her as needed   Vanna Scotland, MD  [facility] [address], [address] [place], [place] [address] [phone]

## 2020-10-12 ENCOUNTER — Ambulatory Visit (INDEPENDENT_AMBULATORY_CARE_PROVIDER_SITE_OTHER): Payer: BC Managed Care – PPO | Admitting: Obstetrics and Gynecology

## 2020-10-12 ENCOUNTER — Other Ambulatory Visit: Payer: Self-pay

## 2020-10-12 ENCOUNTER — Encounter: Payer: Self-pay | Admitting: Obstetrics and Gynecology

## 2020-10-12 VITALS — Ht 64.0 in | Wt 172.0 lb

## 2020-10-12 DIAGNOSIS — F419 Anxiety disorder, unspecified: Secondary | ICD-10-CM

## 2020-10-12 DIAGNOSIS — F32A Depression, unspecified: Secondary | ICD-10-CM

## 2020-10-12 MED ORDER — BUSPIRONE HCL 5 MG PO TABS
5.0000 mg | ORAL_TABLET | Freq: Three times a day (TID) | ORAL | 3 refills | Status: DC | PRN
Start: 2020-10-12 — End: 2021-05-26

## 2020-10-12 MED ORDER — CITALOPRAM HYDROBROMIDE 20 MG PO TABS: 20.0000 mg | ORAL_TABLET | Freq: Every day | ORAL | 2 refills | Status: DC

## 2020-10-12 NOTE — Progress Notes (Signed)
Patient ID: Grace Mendoza, female   DOB: 19-Sep-1995, 25 y.o.   MRN: [id number]  Reason for Consult: Anxiety   Referred by Erick Colace, MD  Subjective:     HPI:  Grace Mendoza is a 25 y.o. female she presents today with concerns regarding anxiety.  She reports that she has been suffering with anxiety for a [place] time although this is the first time that she has been willing to talk about it.  She reports that she has often dismissed her anxiety as a school related stress however she graduated in May and her anxious thoughts have continued.  She reports that her anxiety seems to be worsening.  She is having a lot of intrusive thoughts that make her feel anxious.  She gives an example of a recent speeding ticket that she had which has now made it almost impossible for her to drive without feeling incredibly anxious.  She constantly replaced scenarios from driving and wonders if she has done something wrong and may receive a ticket in the mail.  She is feeling sick to her stomach with these thoughts.  Although she continues to fixate on them.  She reports that she generally is so worried throughout the day that she has been exhausted when it is time to sleep at night and if she is able to sleep a lot.    She did not get into the [employer]et school of her choice although she now is almost relieved that because she did not because she would have been able to deal with that pressure.  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  She says that she feels like she is functioning but not thriving and she would like to start enjoying life more.  She feels like there is a war going on in her mind.    GAD-7 18   PHQ-9 6  Gynecological History Patient's last menstrual period was 09/21/2020.   Past Medical History:  Diagnosis Date   Bronchitis    Family history of ovarian cancer    5/22 cancer genetic testing letter sent   Headache    migraines   Kidney stones    Family History  Problem Relation Age of Onset   Ovarian cancer Paternal Aunt 39   Past Surgical History:  Procedure Laterality Date   CYSTOSCOPY WITH STENT PLACEMENT Right 06/07/2020   Procedure: CYSTOSCOPY WITH STENT PLACEMENT;  Surgeon: Crista Elliot, MD;  Location: [facility];  Service: Urology;  Laterality: Right;   PILONIDAL CYST EXCISION N/A 12/05/2017   Procedure: CYST EXCISION PILONIDAL EXTENSIVE;  Surgeon: Earline Mayotte, MD;  Location: [facility];  Service: General;  Laterality: N/A;   TONSILLECTOMY  2004   URETEROSCOPY Right 06/07/2020   Procedure: DIAGNOSTIC URETEROSCOPY;  Surgeon: Crista Elliot, MD;  Location: [facility];  Service: Urology;  Laterality: Right;    Short Social History:  Social History   Tobacco Use   Smoking status: Never   Smokeless tobacco: Never  Substance Use Topics   Alcohol use: No    Allergies  Allergen Reactions   Bee Venom     Current  Outpatient Medications  Medication Sig Dispense Refill   busPIRone (BUSPAR) 5 MG tablet Take 1 tablet (5 mg total) by mouth 3 (three) times daily as needed (anxiety). 90 tablet 3   citalopram (CELEXA) 20 MG tablet Take 1 tablet (20 mg total) by mouth daily. 30 tablet 2   NIKKI 3-0.02 MG tablet Take 1 tablet by mouth daily. 84 tablet 3   No current facility-administered medications for this visit.    Review of Systems  Constitutional: Negative for chills, fatigue, fever and unexpected weight change.  HENT: Negative for trouble swallowing.  Eyes: Negative for loss of vision.  Respiratory: Negative for cough, shortness of breath and wheezing.  Cardiovascular: Negative for chest pain, leg swelling, palpitations and syncope.  GI:  Negative for abdominal pain, blood in stool, diarrhea, nausea and vomiting.  GU: Negative for difficulty urinating, dysuria, frequency and hematuria.  Musculoskeletal: Negative for back pain, leg pain and joint pain.  Skin: Negative for rash.  Neurological: Negative for dizziness, headaches, light-headedness, numbness and seizures.  Psychiatric: Negative for behavioral problem, confusion, depressed mood and sleep disturbance.       Objective:  Objective   Vitals:   10/12/20 1027  Weight: 172 lb (78 kg)  Height: 5\' 4"  (1.626 m)   Body mass index is 29.52 kg/m.  Physical Exam Vitals and nursing note reviewed. Exam conducted with a chaperone present.  Constitutional:      Appearance: Normal appearance.  HENT:     Head: Normocephalic and atraumatic.  Eyes:     Extraocular Movements: Extraocular movements intact.     Pupils: Pupils are equal, round, and reactive to light.  Cardiovascular:     Rate and Rhythm: Normal rate and regular rhythm.  Pulmonary:     Effort: Pulmonary effort is normal.     Breath sounds: Normal breath sounds.  Abdominal:     General: Abdomen is flat.     Palpations: Abdomen is soft.  Musculoskeletal:     Cervical back: Normal range of motion.  Skin:    General: Skin is warm and dry.  Neurological:     General: No focal deficit present.     Mental Status: She is alert and oriented to person, place, and time.  Psychiatric:        Behavior: Behavior normal.        Thought Content: Thought content normal.        Judgment: Judgment normal.    Assessment/Plan:     25 yo with generalized anxiety Reviewed options for management with the patient.  She is most interested in starting a daily SSRI and an as needed medication.  Prescription for Celexa and Buspar Encouraged patient to consider connecting with a therapist.  Referral to Dr. 21 for local psychiatry support.  Will plan close follow up with the patient in 4 weeks.   More than 30 minutes  were spent face to face with the patient in the room, reviewing the medical record, labs and images, and coordinating care for the patient. The plan of management was discussed in detail and counseling was provided.     Maryruth Bun MD [facility], [facility] 10/12/2020 11:18 AM

## 2020-10-12 NOTE — Patient Instructions (Signed)
LOCAL RESOURCES  [facility] [place], [place] Same Day Access Hours:  Monday, Wednesday and Friday, 8am - 3pm Walk-In Crisis Hours: 7 days/wk, 8am - 8pm [address], [place], [place] [address] [phone]  [employer] [phone]  Mobile Crisis Unit [phone]   Therapists/Counselors/Psychologists  [facility] [address], [facility] [address] [place], [place] [address] [phone]  Karen [place], [place]  & Jacqlyn Krauss [place]    [phone]        [address]       [place], [place] [address]        Ival Bible, CSW [phone] [address] [place], [place] [address]  Harle Battiest, [place]        [phone]        [address], [address]      [place], [place] [address]        Chyrel Masson, MS [phone] [address] [place], [place] [address]   Oscar La, LMFT       [phone]        [address]       [place], [place] [address]        Felecia Jan [phone] [address] [place], [place] [address]  [place]        [phone]        [address]       [place], [place] [address]        Kerin Salen [phone] [address] [place], [place] [address]  Tyron Russell, PsyD       [phone]        [address]       [place], [place] [address]        Elita Quick, LPC [phone] [address]  [place], [place] [address]   Debarah Crape        [phone]        [address] [place], [place] [address]         Rosana Hoes [facility] [phone] [email or web address]   Sation Konchella       [phone]        [address]       [place], [place] [address]        [facility] [phone] [email or web address]   ADD/ADHD Testing:   Lynetta Mare, PhD [address] [place], [place] [address] [phone] [facility] in  [place], [place]   [place], [place] [facility] [address] [place], [place] [address] Phone: [phone] Fax: [phone] [place] , [place], Medicaid   Legrand Rams [address] [place], [place] [address] [phone]       Managing Anxiety, Adult After being diagnosed with an anxiety disorder, you may be relieved to know why you have felt or behaved a certain way. You may also feel overwhelmed about the treatment ahead and what it will mean for your life. With care and support, you can manage this condition and recover from it. How to manage lifestyle changes Managing stress and anxiety Stress is your body's reaction to life changes and events, both good and bad. Most stress will last just a few hours, but stress can be ongoing and can lead to more than just stress. Although stress can play a major role in anxiety, it is not the same as anxiety.  Stress is usually caused by something external, such as a deadline, test, or competition. Stress normally passes after the triggering event has ended.  Anxiety is caused by something internal, such as imagining a terrible outcome or worrying that something will go wrong that will devastate you. Anxiety often does not go away even after the triggering event is over, and it can become Mcdonell-term (chronic) worry. It is important to understand the differences between stress and anxiety and to manage your stress effectively so that it does not lead to an anxious response. Talk with your health care provider or a counselor to learn more about reducing anxiety and stress. He or she may suggest tension reduction techniques, such as: Music therapy. This can include creating or listening to music that you enjoy and that inspires you. Mindfulness-based meditation. This involves being aware of your normal breaths while not trying to control your breathing. It can be done while sitting or walking. Centering prayer. This involves focusing on a word,  phrase, or sacred image that means something to you and brings you peace. Deep breathing. To do this, expand your stomach and inhale slowly through your nose. Hold your breath for 3-5 seconds. Then exhale slowly, letting your stomach muscles relax. Self-talk. This involves identifying thought patterns that lead to anxiety reactions and changing those patterns. Muscle relaxation. This involves tensing muscles and then relaxing them. Choose a tension reduction technique that suits your lifestyle and personality. These techniques take time and practice. Set aside 5-15 minutes a day to do them. Therapists can offer counseling and training in these techniques. The training to help with anxiety may be covered by some insurance plans. Other things you can do to manage stress and anxiety include: Keeping a stress/anxiety diary. This can help you learn what triggers your reaction and then learn ways to manage your response. Thinking about how you react to certain situations. You may not be able to control everything, but you can control your response. Making time for activities that help you relax and not feeling guilty about spending your time in this way. Visual imagery and yoga can help you stay calm and relax.  Medicines Medicines can help ease symptoms. Medicines for anxiety include: Anti-anxiety drugs. Antidepressants. Medicines are often used as a primary treatment for anxiety disorder. Medicines will be prescribed by a health care provider. When used together, medicines, psychotherapy, and tension reduction techniques may be the most effective treatment. Relationships Relationships can play a big part in helping you recover. Try to spend more time connecting with trusted friends and family members. Consider going to couples counseling, taking family education classes, or going to family therapy. Therapy can help you and others better understand your condition. How to recognize changes in your  anxiety Everyone responds differently to treatment for anxiety. Recovery from anxiety happens when symptoms decrease and stop interfering with your daily activities at home or work. This may mean that you will start to: Have better concentration and focus. Worry will interfere less in your daily thinking. Sleep better. Be less irritable. Have more energy. Have improved memory. It is important to recognize when your condition is getting worse. Contact your health care provider if your symptoms interfere with home or work and you feel like your condition is not improving. Follow these instructions at home: Activity Exercise. Most adults should do the following: Exercise for at least 150 minutes each week. The exercise should increase your heart rate and make you sweat (moderate-intensity exercise). Strengthening exercises  at least twice a week. Get the right amount and quality of sleep. Most adults need 7-9 hours of sleep each night. Lifestyle  Eat a healthy diet that includes plenty of vegetables, fruits, whole grains, low-fat dairy products, and lean protein. Do not eat a lot of foods that are high in solid fats, added sugars, or salt. Make choices that simplify your life. Do not use any products that contain nicotine or tobacco, such as cigarettes, e-cigarettes, and chewing tobacco. If you need help quitting, ask your health care provider. Avoid caffeine, alcohol, and certain over-the-counter cold medicines. These may make you feel worse. Ask your pharmacist which medicines to avoid. General instructions Take over-the-counter and prescription medicines only as told by your health care provider. Keep all follow-up visits as told by your health care provider. This is important. Where to find support You can get help and support from these sources: Self-help groups. Online and [employer]. A trusted spiritual leader. Couples counseling. Family education classes. Family  therapy. Where to find more information You may find that joining a support group helps you deal with your anxiety. The following sources can help you locate counselors or support groups near you: Mental Health America: www.mentalhealthamerica.net Anxiety and Depression Association of [place] (ADAA): ProgramCam.de The [employer] on Mental Illness (NAMI): www.nami.org Contact a health care provider if you: Have a hard time staying focused or finishing daily tasks. Spend many hours a day feeling worried about everyday life. Become exhausted by worry. Start to have headaches, feel tense, or have nausea. Urinate more than normal. Have diarrhea. Get help right away if you have: A racing heart and shortness of breath. Thoughts of hurting yourself or others. If you ever feel like you may hurt yourself or others, or have thoughts about taking your own life, get help right away. You can go to your nearest emergency department or call: Your local emergency services (911 in the [place] A suicide crisis helpline, such as the National Suicide Prevention Lifeline at (516) 858-9490. This is open 24 hours a day. Summary Taking steps to learn and use tension reduction techniques can help calm you and help prevent triggering an anxiety reaction. When used together, medicines, psychotherapy, and tension reduction techniques may be the most effective treatment. Family, friends, and partners can play a big part in helping you recover from an anxiety disorder. This information is not intended to replace advice given to you by your health care provider. Make sure you discuss any questions you have with your health care provider. Document Revised: 05/28/2018 Document Reviewed: 05/28/2018 Elsevier Patient Education  2022 Elsevier Inc. Buspirone Tablets What is this medication? BUSPIRONE (byoo SPYE rone) treats anxiety. It works by balancing the levels of dopamine and serotonin in your brain, hormones that help  regulate mood. This medicine may be used for other purposes; ask your health care provider or pharmacist if you have questions. COMMON BRAND NAME(S): BuSpar What should I tell my care team before I take this medication? They need to know if you have any of these conditions: Kidney or liver disease An unusual or allergic reaction to buspirone, other medications, foods, dyes, or preservatives Pregnant or trying to get pregnant Breast-feeding How should I use this medication? Take this medication by mouth with a glass of water. Follow the directions on the prescription label. You may take this medication with or without food. To ensure that this medication always works the same way for you, you should take it either always with or always  without food. Take your doses at regular intervals. Do not take your medication more often than directed. Do not stop taking except on the advice of your care team. Talk to your care team about the use of this medication in children. Special care may be needed. Overdosage: If you think you have taken too much of this medicine contact a poison control center or emergency room at once. NOTE: This medicine is only for you. Do not share this medicine with others. What if I miss a dose? If you miss a dose, take it as soon as you can. If it is almost time for your next dose, take only that dose. Do not take double or extra doses. What may interact with this medication? Do not take this medication with any of the following: Linezolid MAOIs like Carbex, Eldepryl, Marplan, Nardil, and Parnate Methylene blue Procarbazine This medication may also interact with the following: Diazepam Digoxin Diltiazem Erythromycin Grapefruit juice Haloperidol Medications for mental depression or mood problems Medications for seizures like carbamazepine, phenobarbital and phenytoin Nefazodone Other medications for anxiety Rifampin Ritonavir Some antifungal medications like  itraconazole, ketoconazole, and voriconazole Verapamil Warfarin This list may not describe all possible interactions. Give your health care provider a list of all the medicines, herbs, non-prescription drugs, or dietary supplements you use. Also tell them if you smoke, drink alcohol, or use illegal drugs. Some items may interact with your medicine. What should I watch for while using this medication? Visit your care team for regular checks on your progress. It may take 1 to 2 weeks before your anxiety gets better. You may get drowsy or dizzy. Do not drive, use machinery, or do anything that needs mental alertness until you know how this medication affects you. Do not stand or sit up quickly, especially if you are an older patient. This reduces the risk of dizzy or fainting spells. Alcohol can make you more drowsy and dizzy. Avoid alcoholic drinks. What side effects may I notice from receiving this medication? Side effects that you should report to your care team as soon as possible: Allergic reactions-skin rash, itching, hives, swelling of the face, lips, tongue, or throat Irritability, confusion, fast or irregular heartbeat, muscle stiffness, twitching muscles, sweating, high fever, seizure, chills, vomiting, diarrhea, which may be signs of serotonin syndrome Side effects that usually do not require medical attention (report to your care team if they continue or are bothersome): Anxiety or nervousness Dizziness Drowsiness Headache Nausea Trouble sleeping This list may not describe all possible side effects. Call your doctor for medical advice about side effects. You may report side effects to FDA at 1-800-FDA-1088. Where should I keep my medication? Keep out of the reach of children. Store at room temperature below 30 degrees C (86 degrees F). Protect from light. Keep container tightly closed. Throw away any unused medication after the expiration date. NOTE: This sheet is a summary. It may not  cover all possible information. If you have questions about this medicine, talk to your doctor, pharmacist, or health care provider.  2022 Elsevier/Gold Standard (2020-03-24 09:59:46) Citalopram Solution What is this medication? CITALOPRAM (sye TAL oh pram) treats depression. It increases the amount of serotonin in the brain, a hormone that helps regulate mood. It belongs to a group of medications called SSRIs. This medicine may be used for other purposes; ask your health care provider or pharmacist if you have questions. COMMON BRAND NAME(S): Celexa What should I tell my care team before I take this medication? They  need to know if you have any of these conditions: Bipolar disorder or a family history of bipolar disorder Bleeding disorders Glaucoma Heart disease History of irregular heartbeat Kidney disease Liver disease Low levels of magnesium or potassium in the blood Receiving electroconvulsive therapy Seizures Suicidal thoughts, plans, or attempt; a previous suicide attempt by you or a family member Take medications that treat or prevent blood clots Thyroid disease An unusual or allergic reaction to citalopram, escitalopram, other medications, foods, dyes, or preservatives Pregnant or trying to become pregnant Breast-feeding How should I use this medication? Take this medication by mouth. Follow the directions on the prescription label. Use a specially marked spoon or container to measure your medication. Ask your pharmacist if you do not have one. Household spoons are not accurate. This medication can be taken with or without food. Take your medication at regular intervals. Do not take your medication more often than directed. Do not stop taking this medication suddenly except upon the advice of your care team. Stopping this medication too quickly may cause serious side effects or your condition may worsen. A special MedGuide will be given to you by the pharmacist with each  prescription and refill. Be sure to read this information carefully each time. Talk to your care team about the use of this medication in children. Special care may be needed. Patients over 107 years old may have a stronger reaction and need a smaller dose. Overdosage: If you think you have taken too much of this medicine contact a poison control center or emergency room at once. NOTE: This medicine is only for you. Do not share this medicine with others. What if I miss a dose? If you miss a dose, take it as soon as you can. If it is almost time for your next dose, take only that dose. Do not take double or extra doses. What may interact with this medication? Do not take this medication with any of the following: Certain medications for fungal infections like fluconazole, itraconazole, ketoconazole, posaconazole, voriconazole Cisapride Dronedarone Escitalopram Linezolid MAOIs like Carbex, Eldepryl, Marplan, Nardil, and Parnate Methylene blue (injected into a vein) Pimozide Thioridazine This medication may also interact with the following: Alcohol Amphetamines Aspirin and aspirin-like medications Carbamazepine Certain medications for depression, anxiety, or psychotic disturbances Certain medications for infections like chloroquine, clarithromycin, erythromycin, furazolidone, isoniazid, pentamidine Certain medications for migraine headaches like almotriptan, eletriptan, frovatriptan, naratriptan, rizatriptan, sumatriptan, zolmitriptan Certain medications for sleep Certain medications that treat or prevent blood clots like dalteparin, enoxaparin, warfarin Cimetidine Diuretics Dofetilide Fentanyl Lithium Methadone Metoprolol NSAIDs, medications for pain and inflammation, like ibuprofen or naproxen Omeprazole Other medications that prolong the QT interval (cause an abnormal heart rhythm) Procarbazine Rasagiline Supplements like St. John's wort, kava kava,  valerian Tramadol Tryptophan Ziprasidone This list may not describe all possible interactions. Give your health care provider a list of all the medicines, herbs, non-prescription drugs, or dietary supplements you use. Also tell them if you smoke, drink alcohol, or use illegal drugs. Some items may interact with your medicine. What should I watch for while using this medication? Tell your care team if your symptoms do not get better or if they get worse. Visit your care team for regular checks on your progress. Because it may take several weeks to see the full effects of this medication, it is important to continue your treatment as prescribed. Watch for new or worsening thoughts of suicide or depression. This includes sudden changes in mood, behavior, or thoughts. These changes  can happen at any time but are more common in the beginning of treatment or after a change in dose. Call your care team right away if you experience these thoughts or worsening depression. Manic episodes may happen in patients with bipolar disorder who take this medication. Watch for changes in feelings or behaviors such as feeling anxious, nervous, agitated, panicky, irritable, hostile, aggressive, impulsive, severely restless, overly excited and hyperactive, or trouble sleeping. These symptoms can happen at anytime but are more common in the beginning of treatment or after a change in dose. Call you care team right away if you notice any of these symptoms. You may get drowsy or dizzy. Do not drive, use machinery, or do anything that needs mental alertness until you know how this medication affects you. Do not stand or sit up quickly, especially if you are an older patient. This reduces the risk of dizzy or fainting spells. Alcohol may interfere with the effect of this medication. Avoid alcoholic drinks. Your mouth may get dry. Chewing sugarless gum or sucking hard candy, and drinking plenty of water may help. Contact your care  team if the problem does not go away or is severe. What side effects may I notice from receiving this medication? Side effects that you should report to your care team as soon as possible: Allergic reactions-skin rash, itching, hives, swelling of the face, lips, tongue, or throat Bleeding-bloody or black, tar-like stools, red or dark brown urine, vomiting blood or brown material that looks like coffee grounds, small, red or purple spots on skin, unusual bleeding or bruising Heart rhythm changes-fast or irregular heartbeat, dizziness, feeling faint or lightheaded, chest pain, trouble breathing Low sodium level-muscle weakness, fatigue, dizziness, headache, confusion Serotonin syndrome-irritability, confusion, fast or irregular heartbeat, muscle stiffness, twitching muscles, sweating, high fever, seizure, chills, vomiting, diarrhea Sudden eye pain or change in vision such as blurry vision, seeing halos around lights, vision loss Thoughts of suicide or self-harm, worsening mood, feelings of depression Side effects that usually do not require medical attention (report to your care team if they continue or are bothersome): Change in sex drive or performance Diarrhea Dry mouth Excessive sweating Nausea Tremors or shaking Upset stomach This list may not describe all possible side effects. Call your doctor for medical advice about side effects. You may report side effects to FDA at 1-800-FDA-1088. Where should I keep my medication? Keep out of reach of children and pets. Store at room temperature between 15 and 30 degrees C (59 and 86 degrees F). Throw away any unused medication after the expiration date. NOTE: This sheet is a summary. It may not cover all possible information. If you have questions about this medicine, talk to your doctor, pharmacist, or health care provider.  2022 Elsevier/Gold Standard (2019-11-13 13:43:56)

## 2020-10-13 ENCOUNTER — Encounter: Payer: Self-pay | Admitting: Obstetrics and Gynecology

## 2020-11-03 ENCOUNTER — Other Ambulatory Visit: Payer: Self-pay | Admitting: Obstetrics and Gynecology

## 2020-11-03 DIAGNOSIS — F419 Anxiety disorder, unspecified: Secondary | ICD-10-CM

## 2020-11-09 ENCOUNTER — Ambulatory Visit: Payer: BC Managed Care – PPO | Admitting: Obstetrics and Gynecology

## 2020-11-29 ENCOUNTER — Ambulatory Visit (INDEPENDENT_AMBULATORY_CARE_PROVIDER_SITE_OTHER): Payer: BC Managed Care – PPO | Admitting: Obstetrics and Gynecology

## 2020-11-29 ENCOUNTER — Other Ambulatory Visit: Payer: Self-pay

## 2020-11-29 ENCOUNTER — Encounter: Payer: Self-pay | Admitting: Obstetrics and Gynecology

## 2020-11-29 VITALS — BP 108/68 | Ht 64.5 in | Wt 168.4 lb

## 2020-11-29 DIAGNOSIS — F411 Generalized anxiety disorder: Secondary | ICD-10-CM | POA: Diagnosis not present

## 2020-11-29 NOTE — Patient Instructions (Signed)
Managing Anxiety, Adult ?After being diagnosed with anxiety, you may be relieved to know why you have felt or behaved a certain way. You may also feel overwhelmed about the treatment ahead and what it will mean for your life. With care and support, you can manage this condition. ?How to manage lifestyle changes ?Managing stress and anxiety ?Stress is your body's reaction to life changes and events, both good and bad. Most stress will last just a few hours, but stress can be ongoing and can lead to more than just stress. Although stress can play a major role in anxiety, it is not the same as anxiety. Stress is usually caused by something external, such as a deadline, test, or competition. Stress normally passes after the triggering event has ended.  ?Anxiety is caused by something internal, such as imagining a terrible outcome or worrying that something will go wrong that will devastate you. Anxiety often does not go away even after the triggering event is over, and it can become [place]-term (chronic) worry. It is important to understand the differences between stress and anxiety and to manage your stress effectively so that it does not lead to an anxious response. ?Talk with your health care provider or a counselor to learn more about reducing anxiety and stress. He or she may suggest tension reduction techniques, such as: ?Music therapy. Spend time creating or listening to music that you enjoy and that inspires you. ?Mindfulness-based meditation. Practice being aware of your normal breaths while not trying to control your breathing. It can be done while sitting or walking. ?Centering prayer. This involves focusing on a word, phrase, or sacred image that means something to you and brings you peace. ?Deep breathing. To do this, expand your stomach and inhale slowly through your nose. Hold your breath for 3-5 seconds. Then exhale slowly, letting your stomach muscles relax. ?Self-talk. Learn to notice and identify  thought patterns that lead to anxiety reactions and change those patterns to thoughts that feel peaceful. ?Muscle relaxation. Taking time to tense muscles and then relax them. ?Choose a tension reduction technique that fits your lifestyle and personality. These techniques take time and practice. Set aside 5-15 minutes a day to do them. Therapists can offer counseling and training in these techniques. The training to help with anxiety may be covered by some insurance plans. ?Other things you can do to manage stress and anxiety include: ?Keeping a stress diary. This can help you learn what triggers your reaction and then learn ways to manage your response. ?Thinking about how you react to certain situations. You may not be able to control everything, but you can control your response. ?Making time for activities that help you relax and not feeling guilty about spending your time in this way. ?Doing visual imagery. This involves imagining or creating mental pictures to help you relax. ?Practicing yoga. Through yoga poses, you can lower tension and promote relaxation. ? ?Medicines ?Medicines can help ease symptoms. Medicines for anxiety include: ?Antidepressant medicines. These are usually prescribed for [place]-term daily control. ?Anti-anxiety medicines. These may be added in severe cases, especially when panic attacks occur. ?Medicines will be prescribed by a health care provider. When used together, medicines, psychotherapy, and tension reduction techniques may be the most effective treatment. ?Relationships ?Relationships can play a big part in helping you recover. Try to spend more time connecting with trusted friends and family members. ?Consider going to couples counseling if you have a partner, taking family education classes, or going to family   therapy. ?Therapy can help you and others better understand your condition. ?How to recognize changes in your anxiety ?Everyone responds differently to treatment for  anxiety. Recovery from anxiety happens when symptoms decrease and stop interfering with your daily activities at home or work. This may mean that you will start to: ?Have better concentration and focus. Worry will interfere less in your daily thinking. ?Sleep better. ?Be less irritable. ?Have more energy. ?Have improved memory. ?It is also important to recognize when your condition is getting worse. Contact your health care provider if your symptoms interfere with home or work and you feel like your condition is not improving. ?Follow these instructions at home: ?Activity ?Exercise. Adults should do the following: ?Exercise for at least 150 minutes each week. The exercise should increase your heart rate and make you sweat (moderate-intensity exercise). ?Strengthening exercises at least twice a week. ?Get the right amount and quality of sleep. Most adults need 7-9 hours of sleep each night. ?Lifestyle ? ?Eat a healthy diet that includes plenty of vegetables, fruits, whole grains, low-fat dairy products, and lean protein. ?Do not eat a lot of foods that are high in fats, added sugars, or salt (sodium). ?Make choices that simplify your life. ?Do not use any products that contain nicotine or tobacco. These products include cigarettes, chewing tobacco, and vaping devices, such as e-cigarettes. If you need help quitting, ask your health care provider. ?Avoid caffeine, alcohol, and certain over-the-counter cold medicines. These may make you feel worse. Ask your pharmacist which medicines to avoid. ?General instructions ?Take over-the-counter and prescription medicines only as told by your health care provider. ?Keep all follow-up visits. This is important. ?Where to find support ?You can get help and support from these sources: ?Self-help groups. ?Online and community organizations. ?A trusted spiritual leader. ?Couples counseling. ?Family education classes. ?Family therapy. ?Where to find more information ?You may find  that joining a support group helps you deal with your anxiety. The following sources can help you locate counselors or support groups near you: ?Mental Health America: www.mentalhealthamerica.net ?Anxiety and Depression Association of America (ADAA): www.adaa.org ?National Alliance on Mental Illness (NAMI): www.nami.org ?Contact a health care provider if: ?You have a hard time staying focused or finishing daily tasks. ?You spend many hours a day feeling worried about everyday life. ?You become exhausted by worry. ?You start to have headaches or frequently feel tense. ?You develop chronic nausea or diarrhea. ?Get help right away if: ?You have a racing heart and shortness of breath. ?You have thoughts of hurting yourself or others. ?If you ever feel like you may hurt yourself or others, or have thoughts about taking your own life, get help right away. Go to your nearest emergency department or: ?Call your local emergency services (911 in the [place] ?Call a suicide crisis helpline, such as the National Suicide Prevention Lifeline at 1-800-273-8255 or 988 in the [place] This is open 24 hours a day in the [place] ?Text the Crisis Text Line at 741741 (in the [place] ?Summary ?Taking steps to learn and use tension reduction techniques can help calm you and help prevent triggering an anxiety reaction. ?When used together, medicines, psychotherapy, and tension reduction techniques may be the most effective treatment. ?Family, friends, and partners can play a big part in supporting you. ?This information is not intended to replace advice given to you by your health care provider. Make sure you discuss any questions you have with your health care provider. ?Document Revised: 07/21/2020 Document Reviewed: 04/18/2020 ?Elsevier Patient   Education ? 2022 Elsevier Inc. ? ?

## 2020-11-29 NOTE — Progress Notes (Signed)
Patient ID: Grace Mendoza, female   DOB: 02-Oct-1995, 25 y.o.   MRN: [id number]  Reason for Consult: med follow up   Referred by Erick Colace, MD  Subjective:     HPI:  Grace Mendoza is a 25 y.o. female. She presents today for follow-up regarding anxiety.  She reports that she has started using BuSpar once a day as needed for anxiety.  She reports that this is helped significantly.  She can feel her self being less nervous.  She initially had some sedation with taking the medication and currently only takes it once at night.  She feels like she notices lasting effects into the morning and has this is helped with anxiety that she had with driving.  She is considering increasing the dose to twice a day since she notices that the effects of BuSpar wear off early afternoon.  She has not yet been able to establish with a therapist or psychiatrist but is interested in doing so.   PHQ-9 4  GAD-7 3  Gynecological History  No LMP recorded. (Menstrual status: Oral contraceptives).  Past Medical History:  Diagnosis Date   Bronchitis    Family history of ovarian cancer    5/22 cancer genetic testing letter sent   Headache    migraines   Kidney stones    Family History  Problem Relation Age of Onset   Ovarian cancer Paternal Aunt 30   Past Surgical History:  Procedure Laterality Date   CYSTOSCOPY WITH STENT PLACEMENT Right 06/07/2020   Procedure: CYSTOSCOPY WITH STENT PLACEMENT;  Surgeon: Crista Elliot, MD;  Location: [facility];  Service: Urology;  Laterality: Right;   PILONIDAL CYST EXCISION N/A 12/05/2017   Procedure: CYST EXCISION PILONIDAL EXTENSIVE;  Surgeon: Earline Mayotte, MD;  Location: [facility];  Service: General;  Laterality: N/A;   TONSILLECTOMY  2004   URETEROSCOPY Right 06/07/2020   Procedure: DIAGNOSTIC URETEROSCOPY;  Surgeon: Crista Elliot, MD;  Location: [facility];  Service: Urology;  Laterality: Right;    Short Social History:  Social History    Tobacco Use   Smoking status: Never   Smokeless tobacco: Never  Substance Use Topics   Alcohol use: No    Allergies  Allergen Reactions   Bee Venom     Current Outpatient Medications  Medication Sig Dispense Refill   busPIRone (BUSPAR) 5 MG tablet Take 1 tablet (5 mg total) by mouth 3 (three) times daily as needed (anxiety). 90 tablet 3   NIKKI 3-0.02 MG tablet Take 1 tablet by mouth daily. 84 tablet 3   citalopram (CELEXA) 20 MG tablet Take 1 tablet (20 mg total) by mouth daily. 30 tablet 2   No current facility-administered medications for this visit.    Review of Systems  Constitutional: Negative for chills, fatigue, fever and unexpected weight change.  HENT: Negative for trouble swallowing.  Eyes: Negative for loss of vision.  Respiratory: Negative for cough, shortness of breath and wheezing.  Cardiovascular: Negative for chest pain, leg swelling, palpitations and syncope.  GI: Negative for abdominal pain, blood in stool, diarrhea, nausea and vomiting.  GU: Negative for difficulty urinating, dysuria, frequency and hematuria.  Musculoskeletal: Negative for back pain, leg pain and joint pain.  Skin: Negative for rash.  Neurological: Negative for dizziness, headaches, light-headedness, numbness and seizures.  Psychiatric: Negative for behavioral problem, confusion, depressed mood and sleep disturbance.       Objective:  Objective   Vitals:   11/29/20 1042  BP: 108/68  Weight: 168 lb 6.4 oz (76.4 kg)  Height: 5' 4.5" (1.638 m)   Body mass index is 28.46 kg/m.  Physical Exam Vitals and nursing note reviewed. Exam conducted with a chaperone present.  Constitutional:      Appearance: Normal appearance.  HENT:     Head: Normocephalic and atraumatic.  Eyes:     Extraocular Movements: Extraocular movements intact.     Pupils: Pupils are equal, round, and reactive to light.  Pulmonary:     Effort: Pulmonary effort is normal.  Skin:    General: Skin is warm and  dry.  Neurological:     General: No focal deficit present.     Mental Status: She is alert and oriented to person, place, and time.  Psychiatric:        Behavior: Behavior normal.        Thought Content: Thought content normal.        Judgment: Judgment normal.    Assessment/Plan:     25 year old following up regarding general anxiety.   She has noticed an improvement in her symptoms with taking BuSpar.  We will plan to continue this medication.  Encourage follow-up with psychiatry and gave her the phone number for Dr. Waylan Boga office.  Dr. Waylan Boga office has been trying to reach out to the patient but has not been successful.  More than 20 minutes were spent face to face with the patient in the room, reviewing the medical record, labs and images, and coordinating care for the patient. The plan of management was discussed in detail and counseling was provided.    Adrian Prows MD [facility], [facility] 11/29/2020 10:53 AM

## 2021-05-26 ENCOUNTER — Ambulatory Visit (INDEPENDENT_AMBULATORY_CARE_PROVIDER_SITE_OTHER): Payer: BC Managed Care – PPO | Admitting: Obstetrics and Gynecology

## 2021-05-26 ENCOUNTER — Encounter: Payer: Self-pay | Admitting: Obstetrics and Gynecology

## 2021-05-26 VITALS — BP 120/80 | Ht 64.5 in | Wt 171.0 lb

## 2021-05-26 DIAGNOSIS — Z01419 Encounter for gynecological examination (general) (routine) without abnormal findings: Secondary | ICD-10-CM

## 2021-05-26 DIAGNOSIS — F32A Depression, unspecified: Secondary | ICD-10-CM | POA: Diagnosis not present

## 2021-05-26 DIAGNOSIS — F419 Anxiety disorder, unspecified: Secondary | ICD-10-CM | POA: Diagnosis not present

## 2021-05-26 DIAGNOSIS — Z3041 Encounter for surveillance of contraceptive pills: Secondary | ICD-10-CM

## 2021-05-26 MED ORDER — BUSPIRONE HCL 5 MG PO TABS: 5.0000 mg | ORAL_TABLET | Freq: Three times a day (TID) | ORAL | 3 refills | Status: DC | PRN

## 2021-05-26 MED ORDER — NIKKI 3-0.02 MG PO TABS: 1.0000 | ORAL_TABLET | Freq: Every day | ORAL | 3 refills | Status: DC

## 2021-05-26 NOTE — Progress Notes (Signed)
Subjective:     Grace Mendoza is a 26 y.o. female P0 with LMP 04/25/21 and BMI 28 who is here for a comprehensive physical exam. The patient reports no problems. She is sexually active using birth control pills for contraception. She reports a monthly 3-5 day period. She denies pelvic pain or abnormal discharge. Patient has been with the same partner for the past 3 years. She is without any complaints  Past Medical History:  Diagnosis Date   Bronchitis    Family history of ovarian cancer    5/22 cancer genetic testing letter sent   Headache    migraines   Kidney stones    Past Surgical History:  Procedure Laterality Date   CYSTOSCOPY WITH STENT PLACEMENT Right 06/07/2020   Procedure: CYSTOSCOPY WITH STENT PLACEMENT;  Surgeon: Crista Elliot, MD;  Location: [facility];  Service: Urology;  Laterality: Right;   PILONIDAL CYST EXCISION N/A 12/05/2017   Procedure: CYST EXCISION PILONIDAL EXTENSIVE;  Surgeon: Earline Mayotte, MD;  Location: [facility];  Service: General;  Laterality: N/A;   TONSILLECTOMY  2004   URETEROSCOPY Right 06/07/2020   Procedure: DIAGNOSTIC URETEROSCOPY;  Surgeon: Crista Elliot, MD;  Location: [facility];  Service: Urology;  Laterality: Right;   Family History  Problem Relation Age of Onset   Ovarian cancer Paternal Aunt 42    Social History   Socioeconomic History   Marital status: Single    Spouse name: Not on file   Number of children: Not on file   Years of education: Not on file   Highest education level: Not on file  Occupational History   Not on file  Tobacco Use   Smoking status: Never   Smokeless tobacco: Never  Vaping Use   Vaping Use: Never used  Substance and Sexual Activity   Alcohol use: No   Drug use: No   Sexual activity: Yes    Birth control/protection: Pill  Other Topics Concern   Not on file  Social History Narrative   Not on file   Social Determinants of Health   Financial Resource Strain: Not on file  Food  Insecurity: Not on file  Transportation Needs: Not on file  Physical Activity: Not on file  Stress: Not on file  Social Connections: Not on file  Intimate Partner Violence: Not on file   Health Maintenance  Topic Date Due   HPV VACCINES (1 - 2-dose series) Never done   Hepatitis C Screening  Never done   TETANUS/TDAP  Never done   COVID-19 Vaccine (3 - Booster for Pfizer series) 07/15/2019   INFLUENZA VACCINE  08/09/2021   PAP-Cervical Cytology Screening  04/14/2022   PAP SMEAR-Modifier  04/14/2022   HIV Screening  Completed       Review of Systems Pertinent items noted in HPI and remainder of comprehensive ROS otherwise negative.   Objective:  Blood pressure 120/80, height 5' 4.5" (1.638 m), weight 171 lb (77.6 kg), last menstrual period 04/25/2021.   GENERAL: Well-developed, well-nourished female in no acute distress.  HEENT: Normocephalic, atraumatic. Sclerae anicteric.  NECK: Supple. Normal thyroid.  LUNGS: Clear to auscultation bilaterally.  HEART: Regular rate and rhythm. BREASTS: Symmetric in size. No palpable masses or lymphadenopathy, skin changes, or nipple drainage. ABDOMEN: Soft, nontender, nondistended. No organomegaly. PELVIC: Declined EXTREMITIES: No cyanosis, clubbing, or edema, 2+ distal pulses.     Assessment:    Healthy female exam.      Plan:    Patient desires  to delay pap smear until next year- normal 2021 Patient declined STI testing Patient desires thyroid panel and diabetes screening and CBC Patient will be contacted with abnormal results Refill on birth control pills and buspar provided See After Visit Summary for Counseling Recommendations

## 2021-05-27 LAB — CBC
Hematocrit: 42.8 % (ref 34.0–46.6)
Hemoglobin: 14.2 g/dL (ref 11.1–15.9)
MCH: 30.2 pg (ref 26.6–33.0)
MCHC: 33.2 g/dL (ref 31.5–35.7)
MCV: 91 fL (ref 79–97)
Platelets: 304 10*3/uL (ref 150–450)
RBC: 4.7 x10E6/uL (ref 3.77–5.28)
RDW: 11.7 % (ref 11.7–15.4)
WBC: 5.8 10*3/uL (ref 3.4–10.8)

## 2021-05-27 LAB — THYROID PANEL WITH TSH
Free Thyroxine Index: 1.8 (ref 1.2–4.9)
T3 Uptake Ratio: 15 % — ABNORMAL LOW (ref 24–39)
T4, Total: 11.8 ug/dL (ref 4.5–12.0)
TSH: 3.15 u[IU]/mL (ref 0.450–4.500)

## 2021-05-27 LAB — HEMOGLOBIN A1C
Est. average glucose Bld gHb Est-mCnc: 91 mg/dL
Hgb A1c MFr Bld: 4.8 % (ref 4.8–5.6)

## 2021-10-24 IMAGING — CT CT RENAL STONE PROTOCOL
2 of 4 series · 16 of 46 positions shown, 18 images · non-contrast
Comparison: None.

CLINICAL DATA: Flank pain.  Nausea and vomiting.

EXAM:
CT ABDOMEN AND PELVIS WITHOUT CONTRAST
TECHNIQUE: Multidetector CT imaging of the abdomen and pelvis was performed
following the standard protocol without IV contrast.

[Series 2: stone full standard · axial · 0.79mm/px · z∈[-804,-360]mm · 13 of 97 slices shown, 15 images]
[im 4/97  soft-tissue]
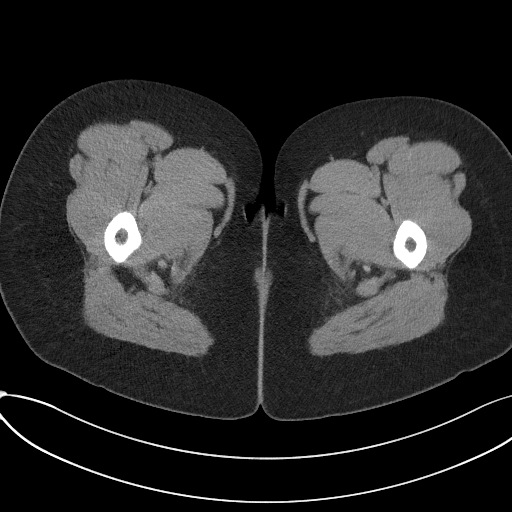
[im 4/97  bone]
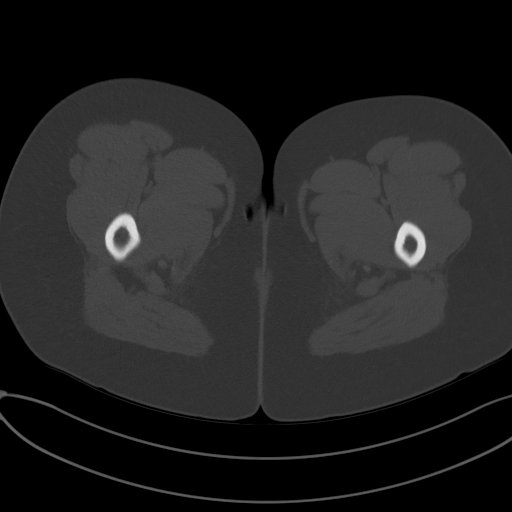
[im 12/97  soft-tissue]
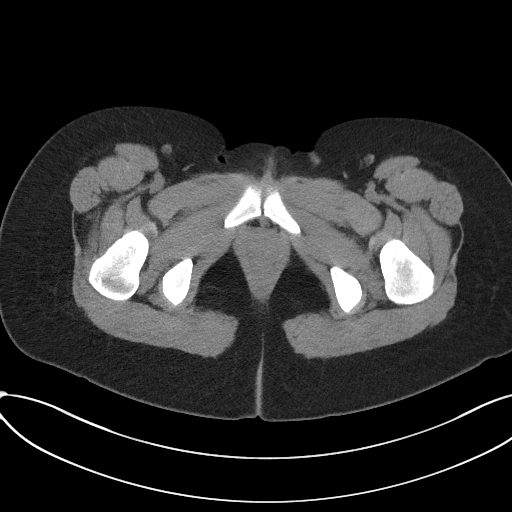
[im 20/97  soft-tissue]
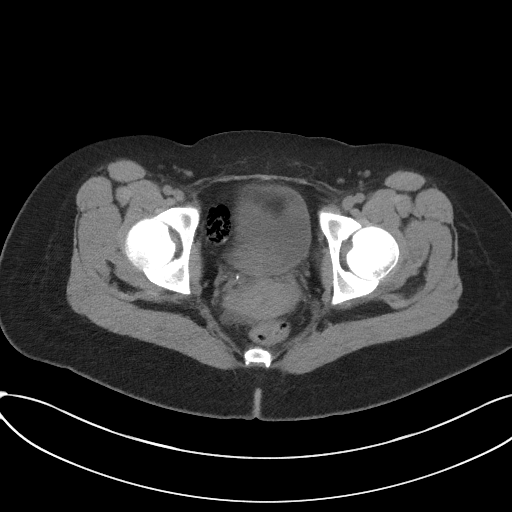
[im 27/97  soft-tissue]
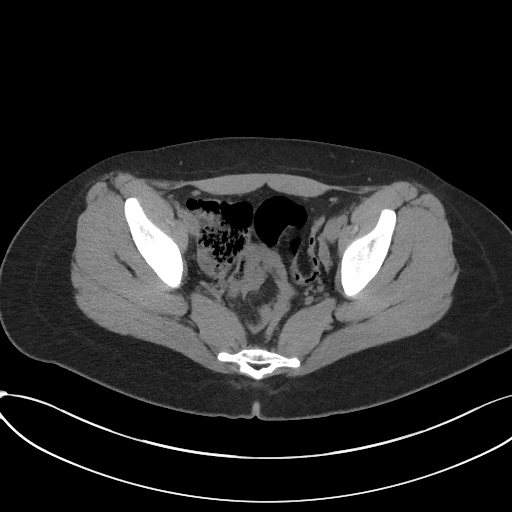
[im 35/97  soft-tissue]
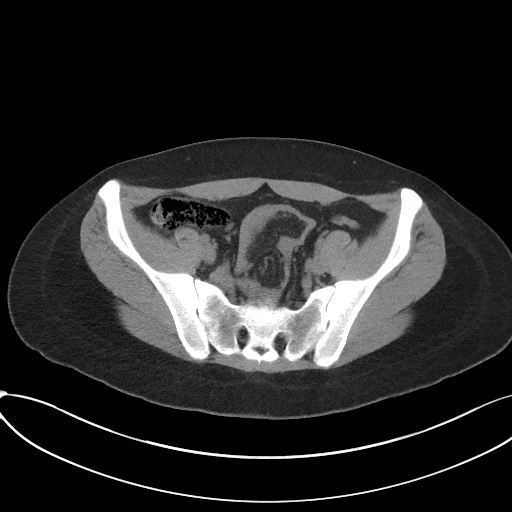
[im 43/97  soft-tissue]
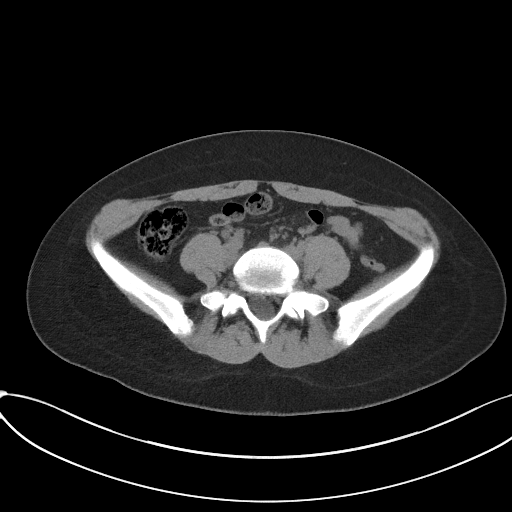
[im 50/97  soft-tissue]
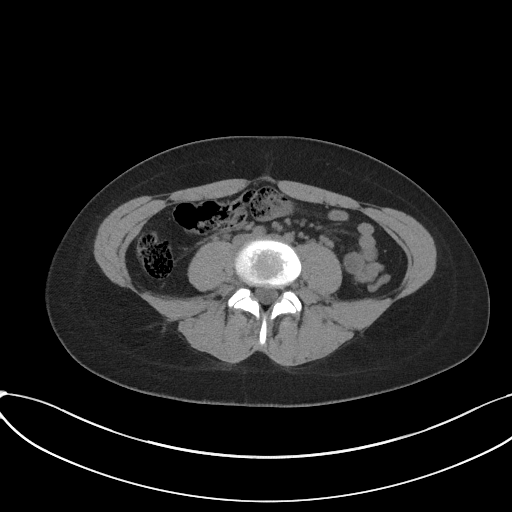
[im 54/97  soft-tissue]
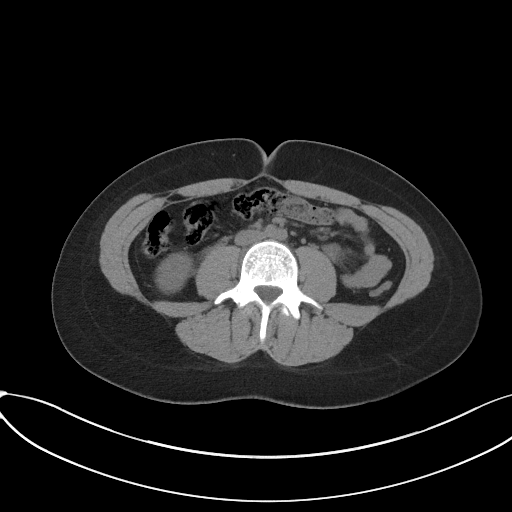
[im 62/97  soft-tissue]
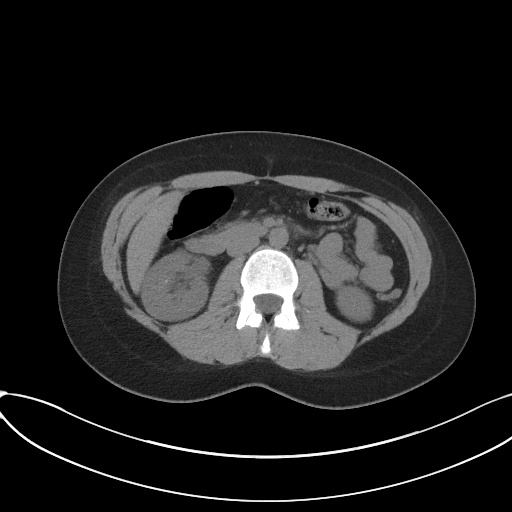
[im 62/97  bone]
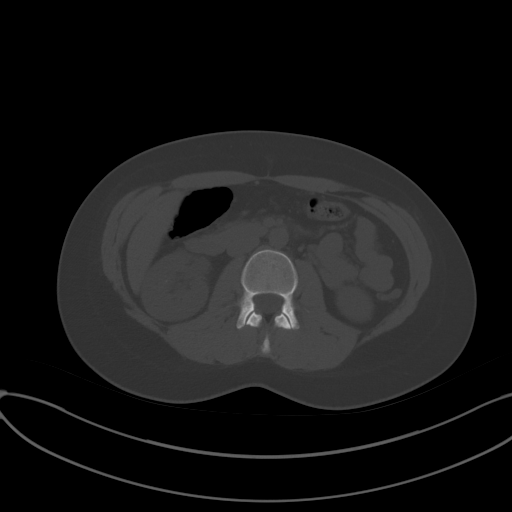
[im 70/97  soft-tissue]
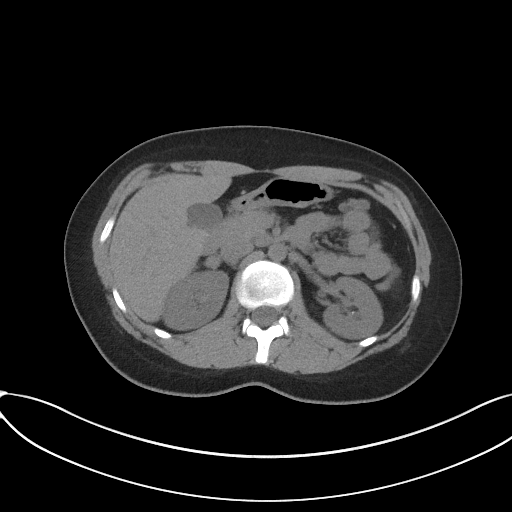
[im 77/97  soft-tissue]
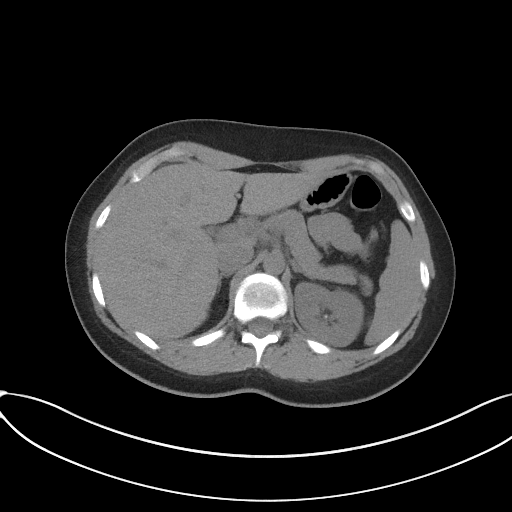
[im 85/97  soft-tissue]
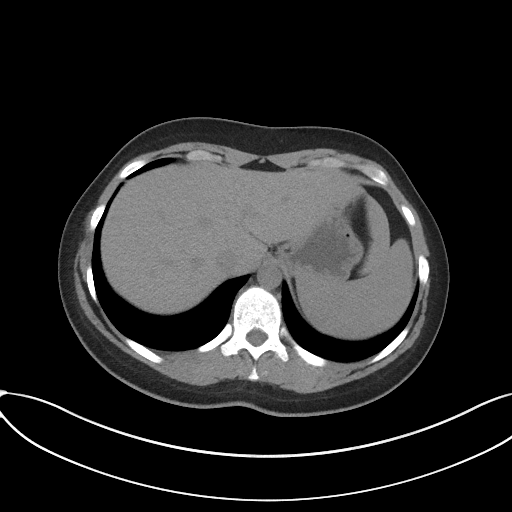
[im 93/97  soft-tissue]
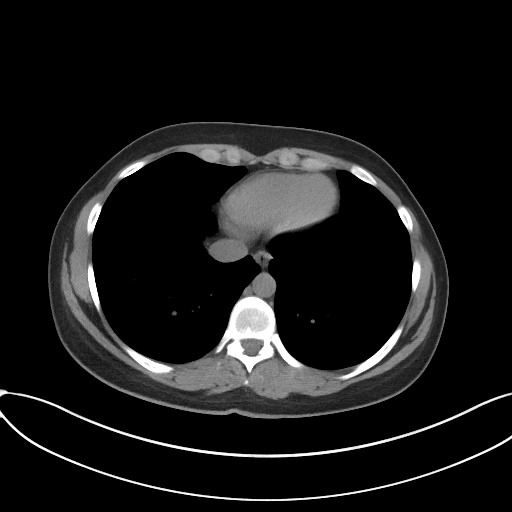

[Series 5: coronal · coronal · 0.69mm/px · 3 of 120 slices shown]
[im 40/120  soft-tissue]
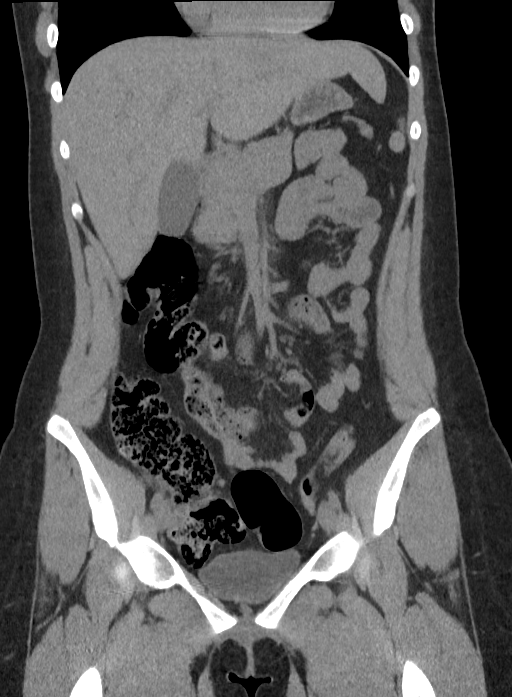
[im 53/120  soft-tissue]
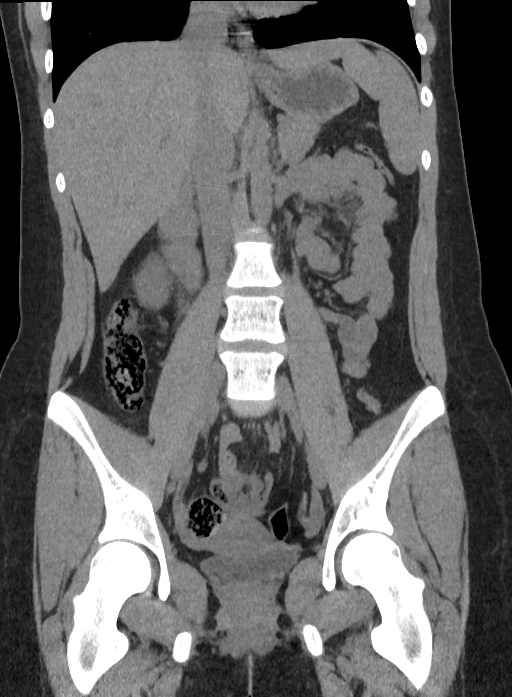
[im 67/120  soft-tissue]
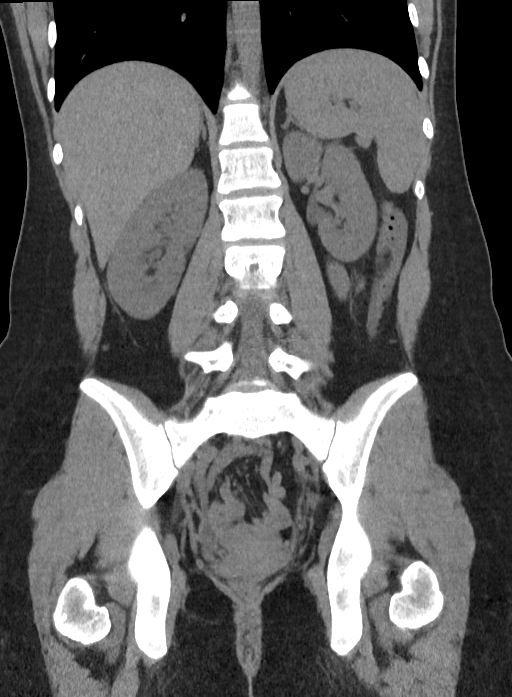

[16 of 46 positions shown; findings below may reference images not displayed]

FINDINGS: Lower chest: Insert lung bases

Hepatobiliary: No focal liver abnormality is seen. No gallstones,
gallbladder wall thickening, or biliary dilatation.

Pancreas: Unremarkable. No pancreatic ductal dilatation or
surrounding inflammatory changes.

Spleen: Normal in size without focal abnormality.

Adrenals/Urinary Tract: Adrenal glands are normal bilaterally.

Five nonobstructing stones are present in the left kidney ranging
from 2 mm to 4 mm.

At least 3 small nonobstructing stones are present in the right
kidney measuring up to 3 mm. Moderate right-sided hydronephrosis is
present. There is some stranding about the right kidney. Right
ureter is dilated to the level of a 3 mm obstructing stone just
above the right ureterovesical junction.

Urinary bladder is within normal limits.

Stomach/Bowel: Stomach is within normal limits. Appendix appears
normal. No evidence of bowel wall thickening, distention, or
inflammatory changes.

Vascular/Lymphatic: No significant vascular findings are present. No
enlarged abdominal or pelvic lymph nodes.

Reproductive: Uterus and bilateral adnexa are unremarkable.

Other: Minimal free fluid within the anatomic pelvis is likely
physiologic. No free fluid is present about the right kidney or
ureter. No free air is present. No significant ventral hernia is
present.

Musculoskeletal: Vertebral body heights and alignment are normal. No
focal lytic or blastic lesion is present. The bony pelvis is within
normal limits.
IMPRESSION: 1. Obstructing 3 mm stone just above the right ureterovesical
junction with moderate right-sided hydronephrosis.
2. Multiple additional nonobstructing stones in both kidneys.

## 2021-10-25 IMAGING — CT CT RENAL STONE PROTOCOL
3 of 4 series · 6 of 46 positions shown, 11 images · non-contrast
Comparison: June 04, 2020

CLINICAL DATA: Urinary tract stone.

EXAM:
CT ABDOMEN AND PELVIS WITHOUT CONTRAST
TECHNIQUE: Multidetector CT imaging of the abdomen and pelvis was performed
following the standard protocol without IV contrast.

[Series 4: lung bases · axial · 0.61mm/px · z∈[-135,-115]mm · 2 of 12 slices shown, 5 images]
[im 4/12  soft-tissue]
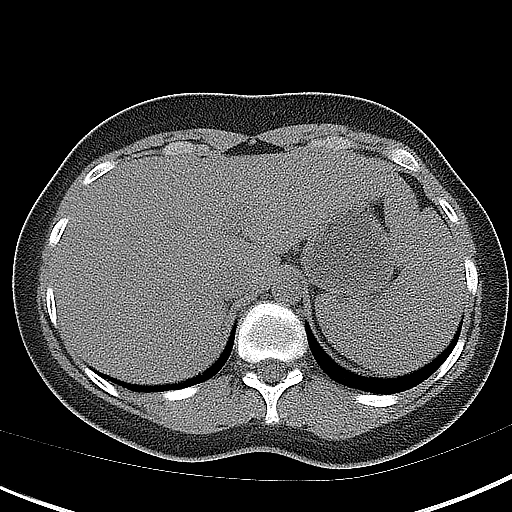
[im 4/12  lung]
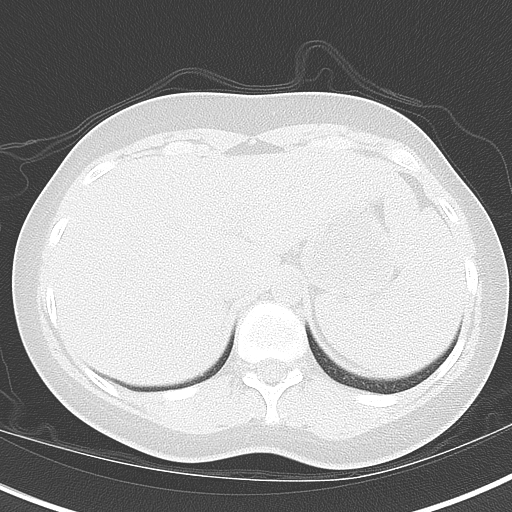
[im 4/12  bone]
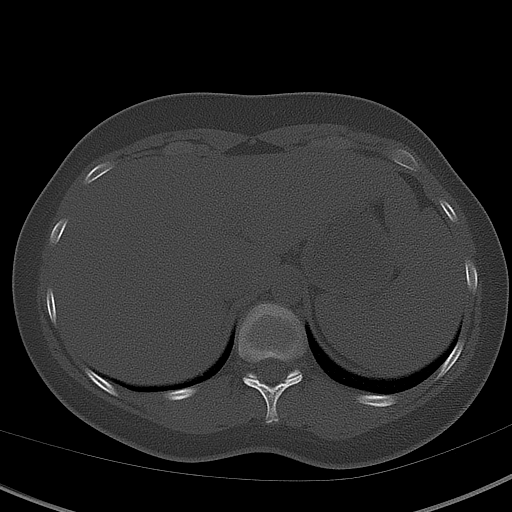
[im 8/12  soft-tissue]
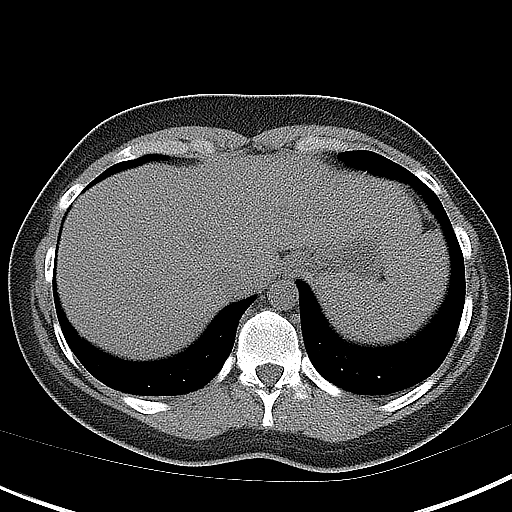
[im 8/12  lung]
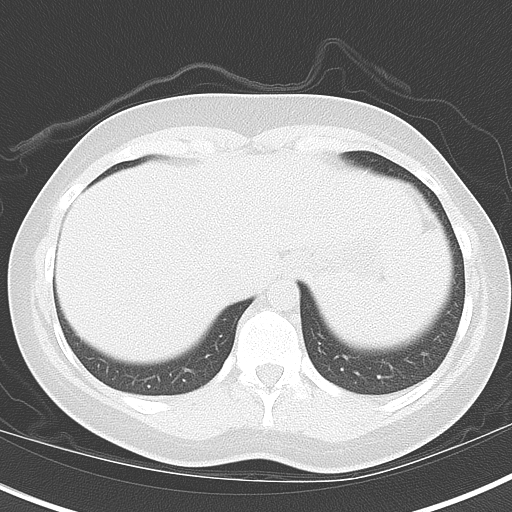

[Series 5: coronal · coronal · 0.73mm/px · 3 of 118 slices shown, 4 images]
[im 40/118  soft-tissue]
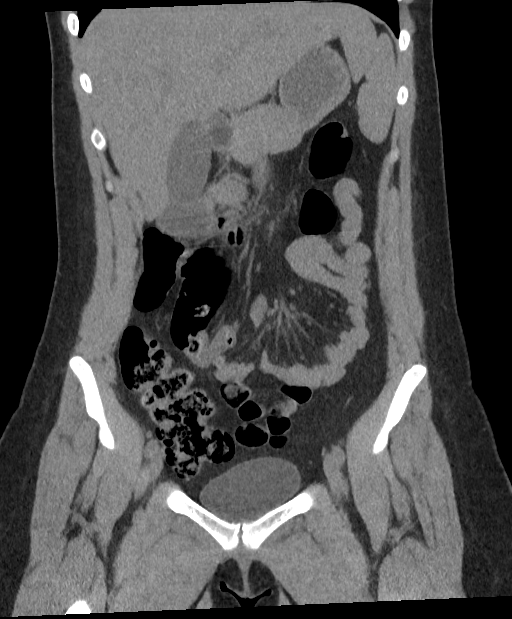
[im 53/118  soft-tissue]
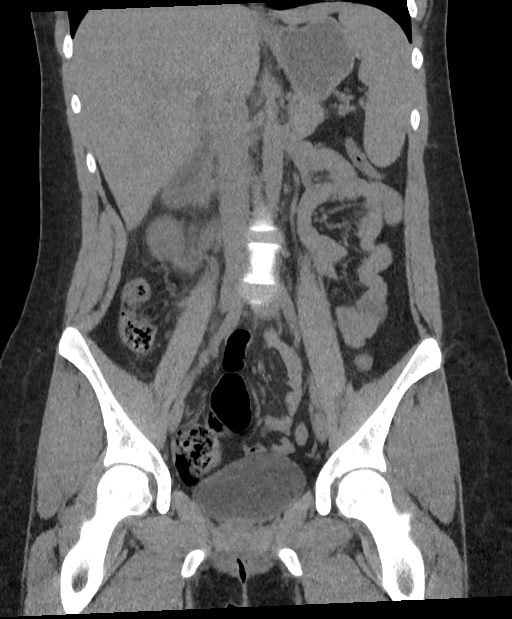
[im 53/118  bone]
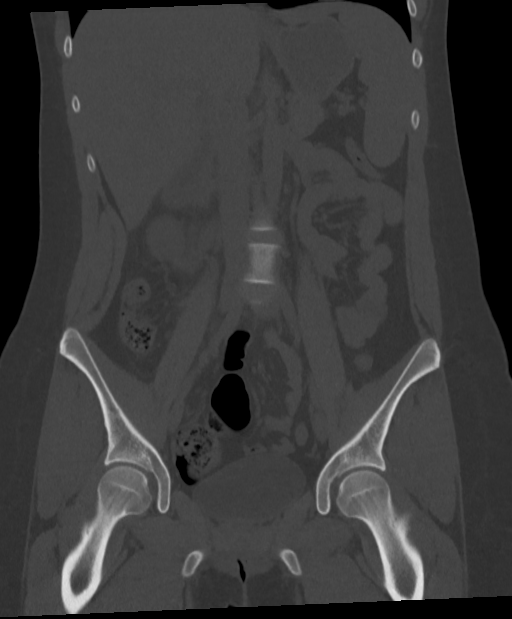
[im 66/118  soft-tissue]
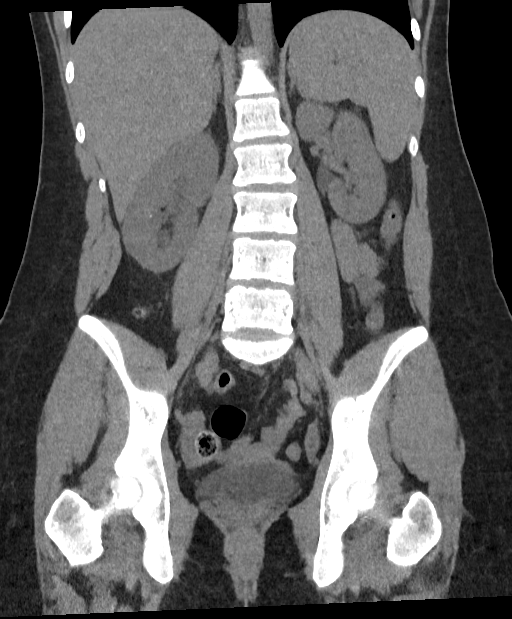

[Series 6: sagittal · sagittal · 0.46mm/px · 1 of 193 slices shown, 2 images]
[im 65/193  soft-tissue]
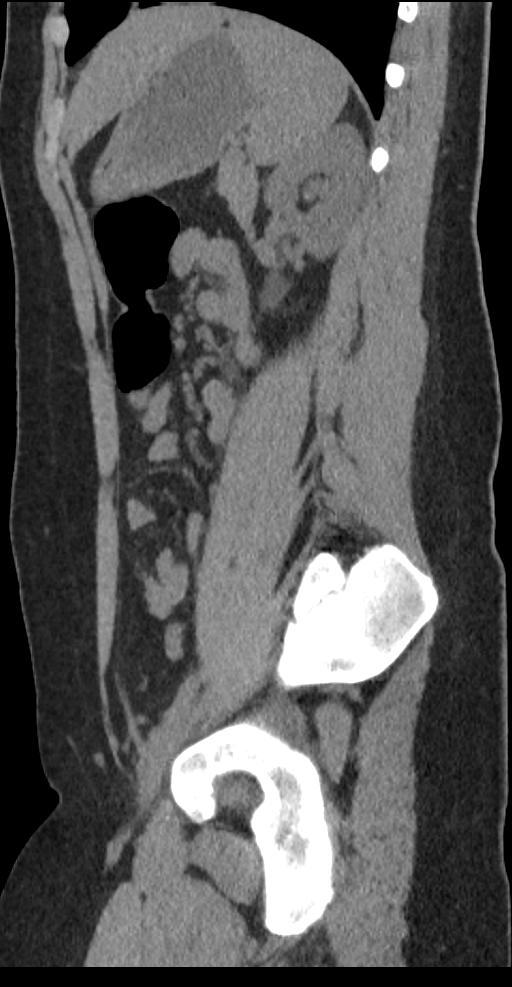
[im 65/193  bone]
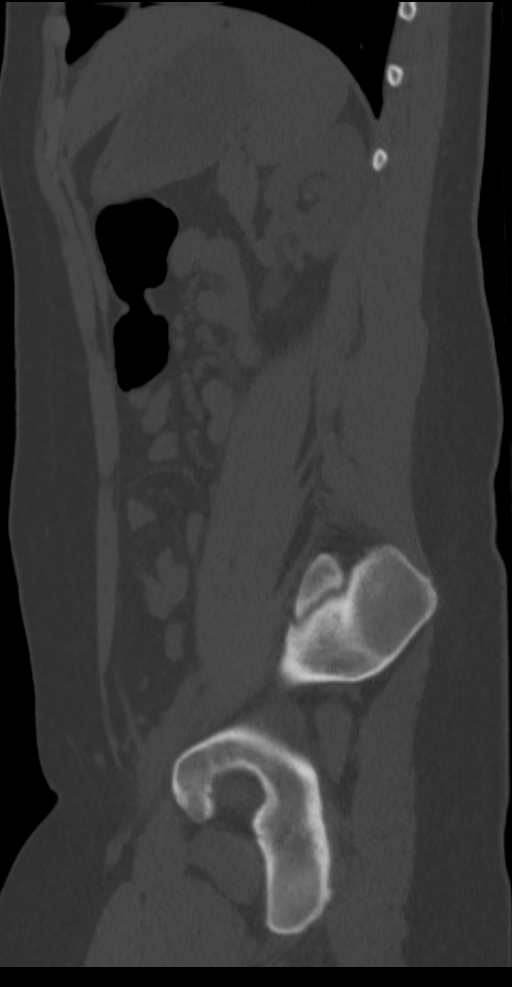

[6 of 46 positions shown; findings below may reference images not displayed]

FINDINGS: Lower chest: No acute abnormality.

Hepatobiliary: No focal liver abnormality is seen. No gallstones,
gallbladder wall thickening, or biliary dilatation.

Pancreas: Unremarkable. No pancreatic ductal dilatation or
surrounding inflammatory changes.

Spleen: Normal in size without focal abnormality.

Adrenals/Urinary Tract: Normal adrenal glands. Moderate right
hydronephrosis and hydroureter likely caused by 2 mm calculus at the
right vesicoureteral junction. The calculus has slightly advanced in
positioning from the prior study. Additional bilateral
nonobstructive renal calculi, the largest in the lower pole of the
left kidney measures 3.3 mm.

Stomach/Bowel: Stomach is within normal limits. Appendix appears
normal. No evidence of bowel wall thickening, distention, or
inflammatory changes.

Vascular/Lymphatic: No significant vascular findings are present. No
enlarged abdominal or pelvic lymph nodes.

Reproductive: Uterus and bilateral adnexa are unremarkable.

Other: No abdominal wall hernia or abnormality. No abdominopelvic
ascites.

Musculoskeletal: No acute or significant osseous findings.
IMPRESSION: 1. Moderate right hydronephrosis and hydroureter likely caused by 2
mm calculus at the right vesicoureteral junction. The calculus has
slightly advanced in positioning from the prior study. Associated
mild right perinephric stranding.
2. Additional bilateral nonobstructive renal calculi.

## 2021-12-08 IMAGING — US US RENAL
1 series · 14 of 25 positions shown · non-contrast
Comparison: June 05, 2020.

CLINICAL DATA: Question of hydronephrosis with renal/ureteral
calculus in a 24-year-old female.

EXAM:
RENAL / URINARY TRACT ULTRASOUND COMPLETE

[Series 1: us renal · 14 of 37 slices shown]
[im 1/37]
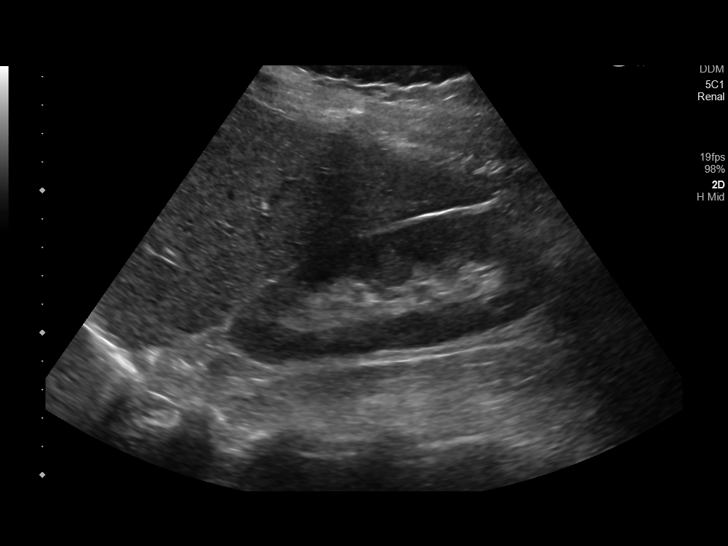
[im 4/37]
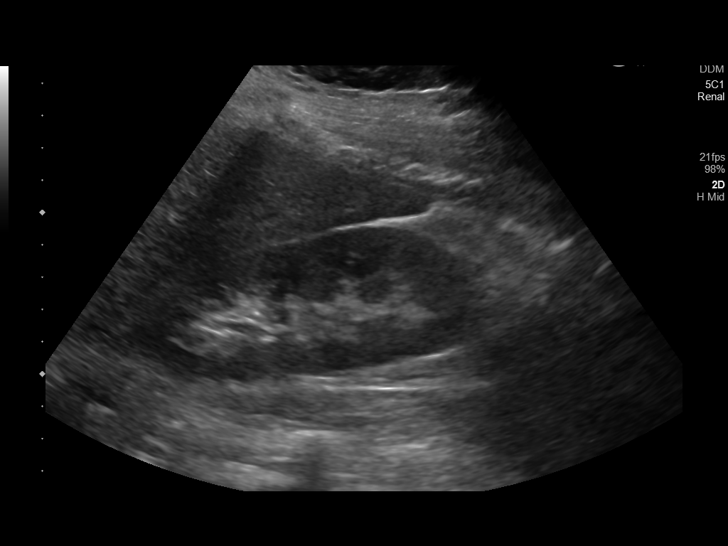
[im 7/37]
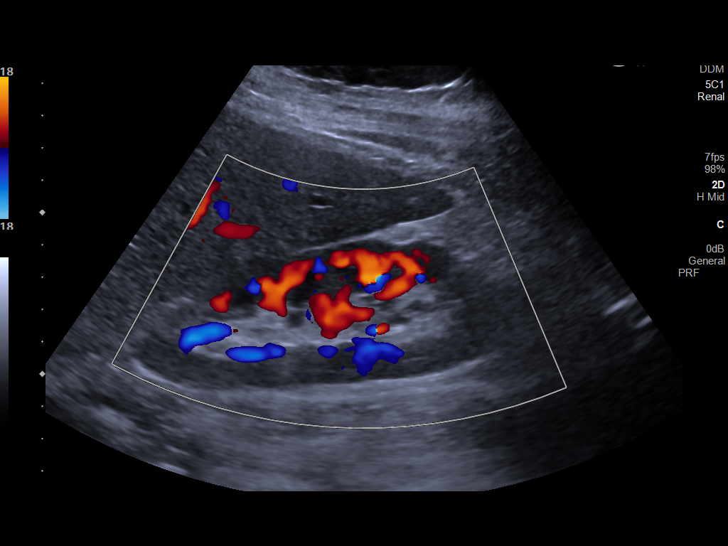
[im 10/37]
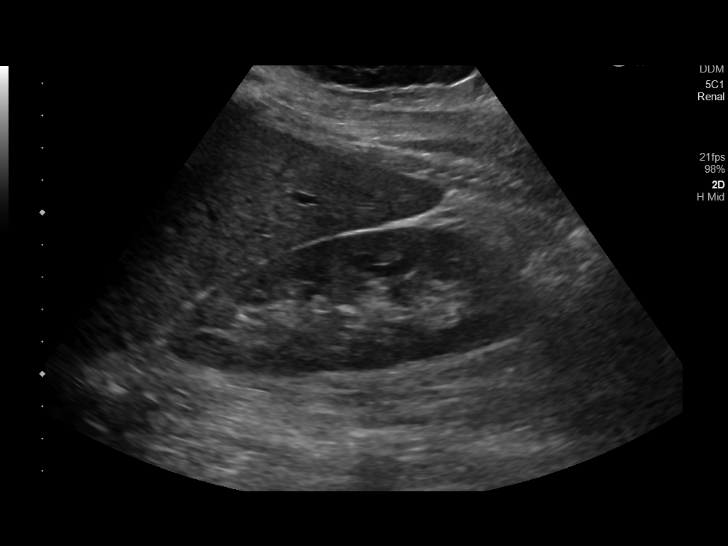
[im 13/37]
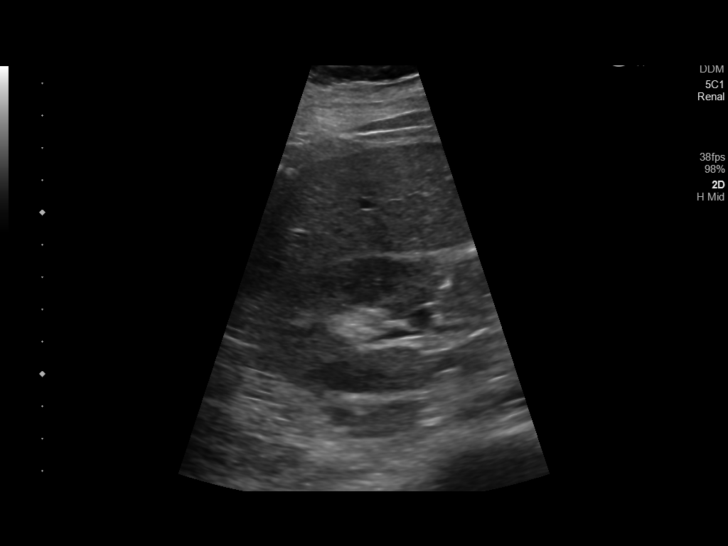
[im 14/37]
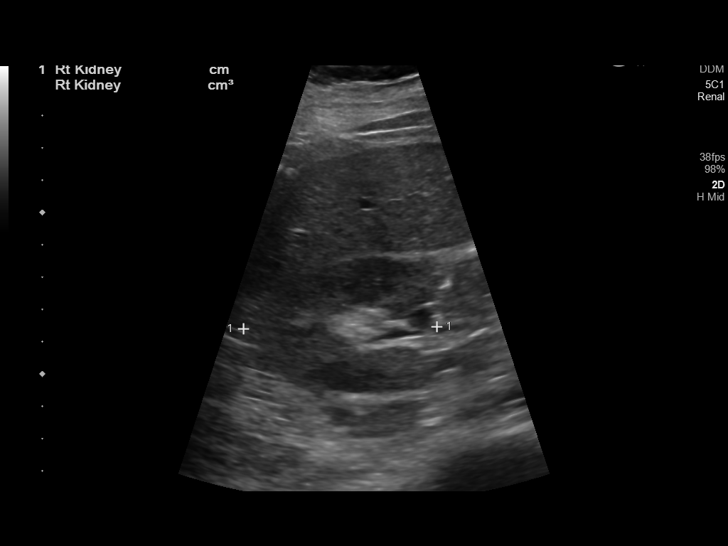
[im 17/37]
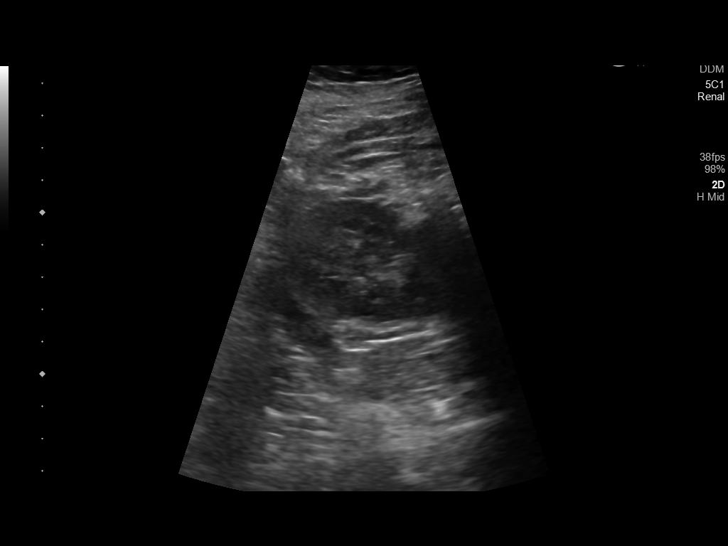
[im 20/37]
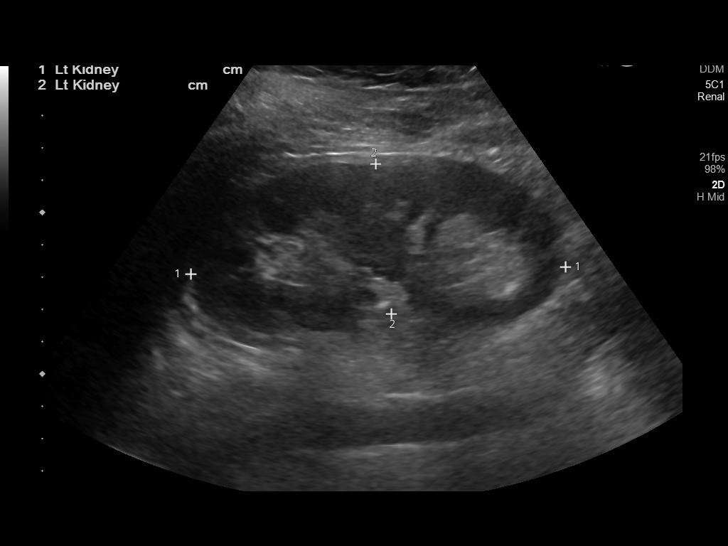
[im 23/37]
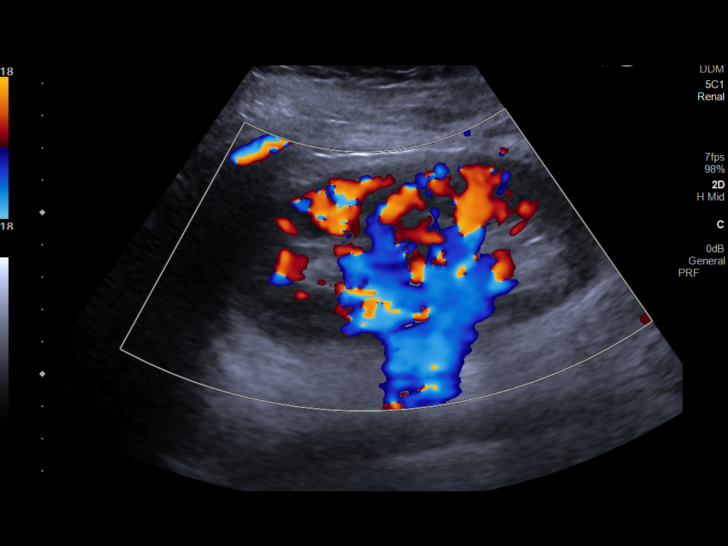
[im 25/37]
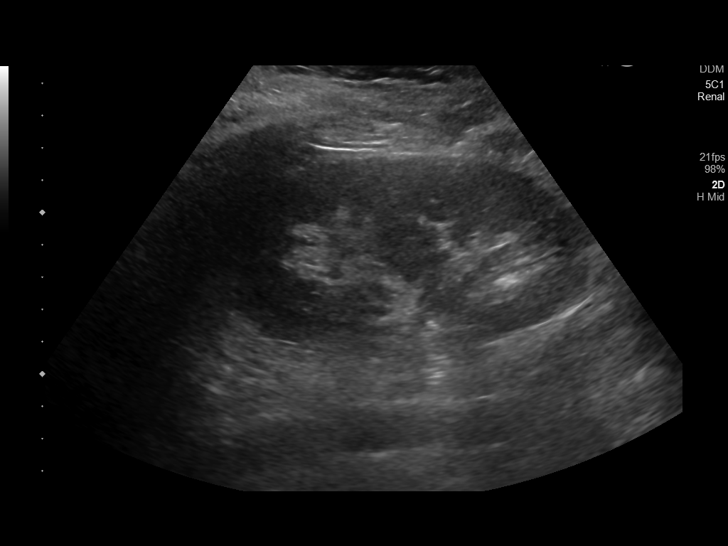
[im 28/37]
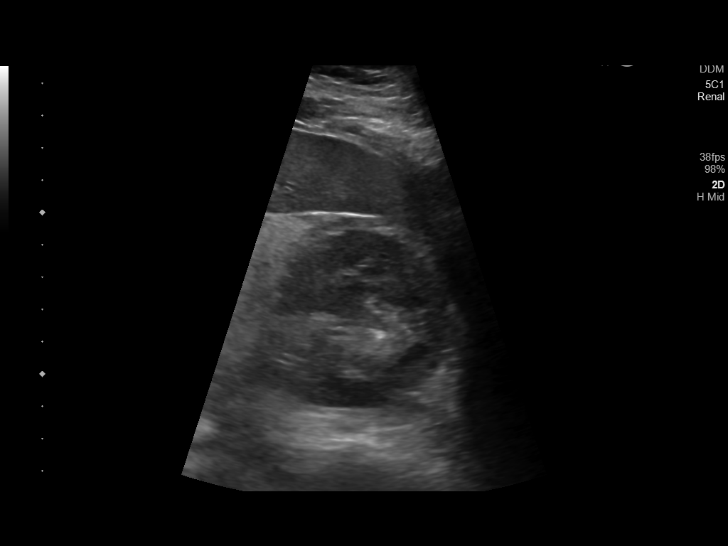
[im 31/37]
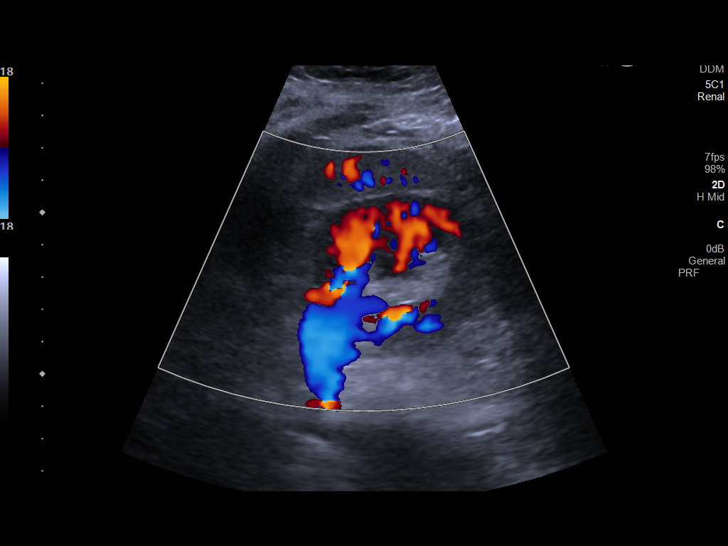
[im 34/37]
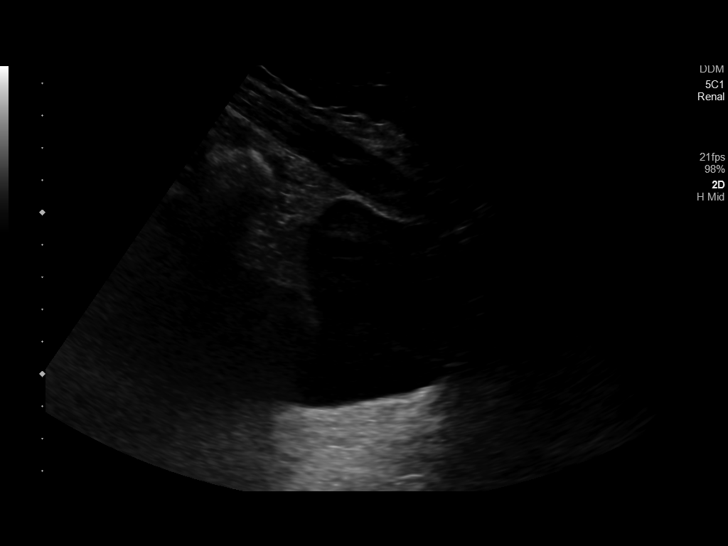
[im 37/37]
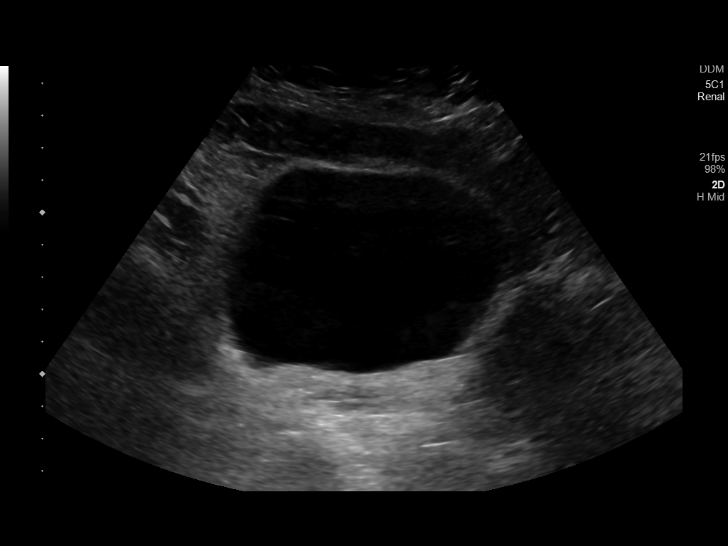

[14 of 25 positions shown; findings below may reference images not displayed]

FINDINGS: Right Kidney:

Renal measurements: 12.4 x 4.5 x 6.0 cm = volume: 175 mL.
Echogenicity within normal limits. No mass or hydronephrosis
visualized.

Left Kidney:

Renal measurements: 11.6 x 4.7 x 4.9 cm = volume: 140 mL.
Echogenicity within normal limits. No mass or hydronephrosis
visualized.

Bladder:

Normal appearance of the urinary bladder given degree of distension.
Bilateral ureteral jets.

Other:

None.
IMPRESSION: Unremarkable renal sonogram.

## 2022-03-19 ENCOUNTER — Ambulatory Visit: Payer: Self-pay

## 2022-03-19 ENCOUNTER — Encounter: Payer: Self-pay | Admitting: Emergency Medicine

## 2022-03-19 ENCOUNTER — Other Ambulatory Visit: Payer: Self-pay

## 2022-03-19 ENCOUNTER — Emergency Department: Payer: BC Managed Care – PPO

## 2022-03-19 ENCOUNTER — Emergency Department
Admission: EM | Admit: 2022-03-19 | Discharge: 2022-03-19 | Disposition: A | Discharge status: Home / Self Care | Payer: BC Managed Care – PPO | Attending: Emergency Medicine | Admitting: Emergency Medicine

## 2022-03-19 DIAGNOSIS — D72829 Elevated white blood cell count, unspecified: Secondary | ICD-10-CM | POA: Diagnosis not present

## 2022-03-19 DIAGNOSIS — R109 Unspecified abdominal pain: Secondary | ICD-10-CM

## 2022-03-19 DIAGNOSIS — N201 Calculus of ureter: Secondary | ICD-10-CM

## 2022-03-19 DIAGNOSIS — N132 Hydronephrosis with renal and ureteral calculous obstruction: Secondary | ICD-10-CM | POA: Diagnosis not present

## 2022-03-19 DIAGNOSIS — R10A2 Flank pain, left side: Secondary | ICD-10-CM

## 2022-03-19 LAB — BASIC METABOLIC PANEL
Anion gap: 10 (ref 5–15)
BUN: 15 mg/dL (ref 6–20)
CO2: 24 mmol/L (ref 22–32)
Calcium: 8.8 mg/dL — ABNORMAL LOW (ref 8.9–10.3)
Chloride: 104 mmol/L (ref 98–111)
Creatinine, Ser: 0.72 mg/dL (ref 0.44–1.00)
GFR, Estimated: >60 mL/min (ref >60)
Glucose, Bld: 122 mg/dL — ABNORMAL HIGH (ref 70–99)
Potassium: 3.4 mmol/L — ABNORMAL LOW (ref 3.5–5.1)
Sodium: 138 mmol/L (ref 135–145)

## 2022-03-19 LAB — CBC WITH DIFFERENTIAL/PLATELET
Abs Immature Granulocytes: 0.07 10*3/uL (ref 0.00–0.07)
Basophils Absolute: 0 10*3/uL (ref 0.0–0.1)
Basophils Relative: 0 %
Eosinophils Absolute: 0.1 10*3/uL (ref 0.0–0.5)
Eosinophils Relative: 1 %
HCT: 41 % (ref 36.0–46.0)
Hemoglobin: 13.4 g/dL (ref 12.0–15.0)
Immature Granulocytes: 1 %
Lymphocytes Relative: 18 %
Lymphs Abs: 2.4 10*3/uL (ref 0.7–4.0)
MCH: 29 pg (ref 26.0–34.0)
MCHC: 32.7 g/dL (ref 30.0–36.0)
MCV: 88.7 fL (ref 80.0–100.0)
Monocytes Absolute: 1 10*3/uL (ref 0.1–1.0)
Monocytes Relative: 7 %
Neutro Abs: 9.9 10*3/uL — ABNORMAL HIGH (ref 1.7–7.7)
Neutrophils Relative %: 73 %
Platelets: 310 10*3/uL (ref 150–400)
RBC: 4.62 MIL/uL (ref 3.87–5.11)
RDW: 12 % (ref 11.5–15.5)
WBC: 13.5 10*3/uL — ABNORMAL HIGH (ref 4.0–10.5)
nRBC: 0 % (ref 0.0–0.2)

## 2022-03-19 LAB — URINALYSIS, ROUTINE W REFLEX MICROSCOPIC
Bacteria, UA: NONE SEEN
Bilirubin Urine: NEGATIVE
Glucose, UA: NEGATIVE mg/dL
Ketones, ur: NEGATIVE mg/dL
Leukocytes,Ua: NEGATIVE
Nitrite: NEGATIVE
Protein, ur: 100 mg/dL — AB
RBC / HPF: >50 RBC/hpf (ref 0–5)
Specific Gravity, Urine: 1.023 (ref 1.005–1.030)
pH: 5 (ref 5.0–8.0)

## 2022-03-19 LAB — HCG, QUANTITATIVE, PREGNANCY: hCG, Beta Chain, Quant, S: <1 m[IU]/mL (ref <5)

## 2022-03-19 LAB — POC URINE PREG, ED: Preg Test, Ur: NEGATIVE

## 2022-03-19 MED ORDER — SODIUM CHLORIDE 0.9 % IV SOLN
1.5000 mg/kg | Freq: Once | INTRAVENOUS | Status: AC
  Administered 2022-03-19: 116 mg via INTRAVENOUS
  Filled 2022-03-19: qty 5.8

## 2022-03-19 MED ORDER — ONDANSETRON 4 MG PO TBDP: 4.0000 mg | ORAL_TABLET | Freq: Two times a day (BID) | ORAL | 0 refills | Status: AC

## 2022-03-19 MED ORDER — TAMSULOSIN HCL 0.4 MG PO CAPS: 0.4000 mg | ORAL_CAPSULE | Freq: Every day | ORAL | 0 refills | Status: DC

## 2022-03-19 MED ORDER — FENTANYL CITRATE PF 50 MCG/ML IJ SOSY
75.0000 ug | PREFILLED_SYRINGE | Freq: Once | INTRAMUSCULAR | Status: AC
  Administered 2022-03-19: 75 ug via INTRAVENOUS
  Filled 2022-03-19: qty 2

## 2022-03-19 MED ORDER — LACTATED RINGERS IV BOLUS
1000.0000 mL | Freq: Once | INTRAVENOUS | Status: AC
  Administered 2022-03-19: 1000 mL via INTRAVENOUS

## 2022-03-19 MED ORDER — METOCLOPRAMIDE HCL 5 MG/ML IJ SOLN
10.0000 mg | Freq: Once | INTRAMUSCULAR | Status: AC
  Administered 2022-03-19: 10 mg via INTRAVENOUS
  Filled 2022-03-19: qty 2

## 2022-03-19 MED ORDER — FENTANYL CITRATE PF 50 MCG/ML IJ SOSY
50.0000 ug | PREFILLED_SYRINGE | Freq: Once | INTRAMUSCULAR | Status: AC
  Administered 2022-03-19: 50 ug via INTRAVENOUS
  Filled 2022-03-19: qty 1

## 2022-03-19 MED ORDER — OXYCODONE HCL 5 MG PO TABS: 5.0000 mg | ORAL_TABLET | Freq: Four times a day (QID) | ORAL | 0 refills | Status: DC | PRN

## 2022-03-19 MED ORDER — ONDANSETRON HCL 4 MG/2ML IJ SOLN
INTRAMUSCULAR | Status: AC
  Administered 2022-03-19: 4 mg via INTRAVENOUS
  Filled 2022-03-19: qty 2

## 2022-03-19 MED ORDER — HYDROMORPHONE HCL 1 MG/ML IJ SOLN
1.0000 mg | Freq: Once | INTRAMUSCULAR | Status: AC
  Administered 2022-03-19: 1 mg via INTRAVENOUS
  Filled 2022-03-19: qty 1

## 2022-03-19 MED ORDER — FENTANYL CITRATE PF 50 MCG/ML IJ SOSY: 50.0000 ug | PREFILLED_SYRINGE | Freq: Once | INTRAMUSCULAR | Status: DC

## 2022-03-19 MED ORDER — TAMSULOSIN HCL 0.4 MG PO CAPS
0.4000 mg | ORAL_CAPSULE | Freq: Once | ORAL | Status: AC
  Administered 2022-03-19: 0.4 mg via ORAL
  Filled 2022-03-19: qty 1

## 2022-03-19 MED ORDER — KETOROLAC TROMETHAMINE 15 MG/ML IJ SOLN
INTRAMUSCULAR | Status: AC
  Administered 2022-03-19: 15 mg
  Filled 2022-03-19: qty 1

## 2022-03-19 MED ORDER — ONDANSETRON HCL 4 MG/2ML IJ SOLN
4.0000 mg | Freq: Once | INTRAMUSCULAR | Status: AC
  Administered 2022-03-19: 4 mg via INTRAVENOUS
  Filled 2022-03-19: qty 2

## 2022-03-19 MED ORDER — ONDANSETRON 4 MG PO TBDP: 4.0000 mg | ORAL_TABLET | Freq: Three times a day (TID) | ORAL | 0 refills | Status: DC | PRN

## 2022-03-19 MED ORDER — KETOROLAC TROMETHAMINE 30 MG/ML IJ SOLN: 15.0000 mg | Freq: Once | INTRAMUSCULAR | Status: DC

## 2022-03-19 NOTE — ED Provider Notes (Signed)
Pain improved markedly.  Still some residual pain in the flank.  I discussed and shared decision making with the patient and her mother who is at bedside regarding disposition either discharge with oral pain medication prescriptions and urology follow-up versus admit for further pain control and the patient opted for discharge at this time.  She understands to follow-up with urology and will call them in the morning tomorrow.  She understands to come back with any new or worsening symptoms.   Lucillie Garfinkel, MD 03/19/22 [phone]

## 2022-03-19 NOTE — Discharge Instructions (Addendum)
Take acetaminophen 650 mg and ibuprofen 400 mg every 6 hours for pain.  Take with food. Take oxycodone as prescribed for for severe/breakthrough pain.  Take MiraLAX if you take the narcotic pain medication because it can get you constipated.  It can also make you sleepy so do not drive or operate machinery when taking this medication. Take Flomax daily as prescribed.  Call Dr. Erlene Quan to schedule a follow-up appointment for your kidney stone.  Use a strainer when you urinate to try to catch the stone and put it in the specimen cup to bring to your urology appointment for analysis as needed.  Thank you for choosing [place] for your health care today!  Please see your primary doctor this week for a follow up appointment.   Sometimes, in the early stages of certain disease courses it is difficult to detect in the emergency department evaluation -- so, it is important that you continue to monitor your symptoms and call your doctor right away or return to the emergency department if you develop any new or worsening symptoms.  Please go to the following website to schedule new (and existing) patient appointments:   [email or web address]  If you do not have a primary doctor try calling the following clinics to establish care:  If you have insurance:  [facility] [phone] [place] [address]   [facility]  [phone] [place] [address]   If you do not have insurance:  [facility]  [phone] [address], [place] [address]   The following is another list of primary care offices in the area who are accepting new patients at this time.  Please reach out to one of them directly and let them know you would like to schedule an appointment to follow up on an Emergency Department visit, and/or to establish a new primary care provider (PCP).  There are likely other primary care  clinics in the are who are accepting new patients, but this is an excellent place to start:  [place] physician: Dr Lavon Paganini [address] [place], [place] [address] [phone]  [facility] Lead Physician: Dr Steele Sizer [address], [place], [place] [address] [phone]  [facility] Physician: Dr Park Liter [address] [place], [place] [address] [phone]  [facility] Lead Physician: Dr Dewaine Oats [place], [place], [place] [address] [phone]  [facility] Physician: Dr Halina Maidens [address] [place], [place] [address] [phone]   It was my pleasure to care for you today.   Hoover Brunette Jacelyn Grip, MD

## 2022-03-19 NOTE — ED Provider Notes (Signed)
[facility] Provider Note    Event Date/Time   First MD Initiated Contact with Patient 03/19/22 [phone]     (approximate)   History   Flank Pain   HPI  Rosezetta Sammut Heinze is a 27 y.o. female who presents to the ED for evaluation of Flank Pain   I review medical admission 2 years ago where she had obstructing ureteral stone with infectious urine requiring a right-sided ureteral stent placement.   Patient presents to the ED with her mother for evaluation of severe left-sided flank pain.  She was at her baseline when the pain struck her suddenly while she was laying in bed tonight.  Associated recurrent emesis with the severe pain.  Prior to this she was at her baseline without dysuria, fever, flank pain or any other issues.   Physical Exam   Triage Vital Signs: ED Triage Vitals [03/19/22 0441]  Enc Vitals Group     BP      Pulse      Resp      Temp      Temp src      SpO2      Weight 171 lb 1.2 oz (77.6 kg)     Height 5' 4.5" (1.638 m)     Head Circumference      Peak Flow      Pain Score 10     Pain Loc      Pain Edu?      Excl. in [place]?     Most recent vital signs: Vitals:   03/19/22 0445  BP: 124/88  Pulse: (!) 117  Resp: 20  Temp: 97.7 F (36.5 C)  SpO2: 100%    General: Awake.  Obviously uncomfortable, heaving CV:  Good peripheral perfusion.  Resp:  Normal effort.  Abd:  No distention.  Soft and benign anteriorly.  Left-sided CVA tenderness is present without any overlying skin changes or signs of trauma to the back MSK:  No deformity noted.  Neuro:  No focal deficits appreciated. Other:     ED Results / Procedures / Treatments   Labs (all labs ordered are listed, but only abnormal results are displayed) Labs Reviewed  BASIC METABOLIC PANEL - Abnormal; Notable for the following components:      Result Value   Potassium 3.4 (*)    Glucose, Bld 122 (*)    Calcium 8.8 (*)    All other components within normal limits  CBC  WITH DIFFERENTIAL/PLATELET - Abnormal; Notable for the following components:   WBC 13.5 (*)    Neutro Abs 9.9 (*)    All other components within normal limits  URINALYSIS, ROUTINE W REFLEX MICROSCOPIC - Abnormal; Notable for the following components:   Color, Urine YELLOW (*)    APPearance CLOUDY (*)    Hgb urine dipstick LARGE (*)    Protein, ur 100 (*)    All other components within normal limits  HCG, QUANTITATIVE, PREGNANCY  POC URINE PREG, ED    EKG   RADIOLOGY CT renal study interpreted by me with 4 mm left ureteral stone  Official radiology report(s): CT Renal Stone Study  Result Date: 03/19/2022 CLINICAL DATA:  27 year old female with history of left-sided flank pain. Emesis. Evaluate for kidney stone. EXAM: CT ABDOMEN AND PELVIS WITHOUT CONTRAST TECHNIQUE: Multidetector CT imaging of the abdomen and pelvis was performed following the standard protocol without IV contrast. RADIATION DOSE REDUCTION: This exam was performed according to the departmental dose-optimization program which includes  automated exposure control, adjustment of the mA and/or kV according to patient size and/or use of iterative reconstruction technique. COMPARISON:  CT of the abdomen and pelvis 06/05/2020. FINDINGS: Lower chest: Unremarkable. Hepatobiliary: No suspicious cystic or solid hepatic lesions are confidently identified on today's noncontrast CT examination. Unenhanced appearance of the gallbladder is unremarkable. Pancreas: No definite pancreatic mass or peripancreatic fluid collections or inflammatory changes are noted on today's noncontrast CT examination. Spleen: Unremarkable. Adrenals/Urinary Tract: Multiple tiny nonobstructive calculi are noted within the collecting systems of both kidneys measuring 1-3 mm in size. In addition, in the proximal third of the left ureter at or immediately beyond the left ureteropelvic junction (coronal image 50 of series 5) there is a 4 mm calculus which is associated  with mild proximal left hydroureteronephrosis. No additional calculi are noted elsewhere along the course of either ureter or within the lumen of the urinary bladder. No right hydroureteronephrosis. Urinary bladder is normal in appearance. Stomach/Bowel: The unenhanced appearance of the stomach is normal. No pathologic dilatation of small bowel or colon. Normal appendix. Vascular/Lymphatic: No atherosclerotic calcifications are noted in the abdominal aorta or pelvic vasculature. No lymphadenopathy noted in the abdomen or pelvis. Reproductive: Uterus and ovaries are unremarkable in appearance. Other: No significant volume of ascites.  No pneumoperitoneum. Musculoskeletal: There are no aggressive appearing lytic or blastic lesions noted in the visualized portions of the skeleton. IMPRESSION: 1. 4 mm obstructive calculus in the proximal third of the left ureter at or immediately beyond the left ureteropelvic junction with mild proximal left hydroureteronephrosis. Multiple additional nonobstructive 1-3 mm calculi are present in the collecting systems of both kidneys (left-greater-than-right). Electronically Signed   By: Vinnie Langton M.D.   On: 03/19/2022 06:14    PROCEDURES and INTERVENTIONS:  Procedures  Medications  tamsulosin (FLOMAX) capsule 0.4 mg (has no administration in time range)  lactated ringers bolus 1,000 mL (has no administration in time range)  lidocaine (XYLOCAINE) 116 mg in sodium chloride 0.9 % 100 mL IVPB (has no administration in time range)  ketorolac (TORADOL) 30 MG/ML injection 15 mg (has no administration in time range)  ketorolac (TORADOL) 15 MG/ML injection (has no administration in time range)  lactated ringers bolus 1,000 mL (0 mLs Intravenous Stopped 03/19/22 0628)  ondansetron (ZOFRAN) injection 4 mg (4 mg Intravenous Given 03/19/22 0554)  fentaNYL (SUBLIMAZE) injection 75 mcg (75 mcg Intravenous Given 03/19/22 0501)  ketorolac (TORADOL) 15 MG/ML injection (15 mg  Given  03/19/22 0554)  metoCLOPramide (REGLAN) injection 10 mg (10 mg Intravenous Given 03/19/22 0625)  fentaNYL (SUBLIMAZE) injection 50 mcg (50 mcg Intravenous Given 03/19/22 0626)     IMPRESSION / MDM / ASSESSMENT AND PLAN / ED COURSE  I reviewed the triage vital signs and the nursing notes.  Differential diagnosis includes, but is not limited to, ureteral stone, pyelonephritis, muscle spasm, UTI  {Patient presents with symptoms of an acute illness or injury that is potentially life-threatening.  27 year old female presents to the ED with left-sided ureteral stone.  She appears quite uncomfortable and consistent with ureteral colic.  Blood work with mild leukocytosis.  Metabolic panel with intact renal function.  Urine with large hemoglobin but nitrite and leukocyte negative, no clear infectious etiology of her symptoms.  Suspect her tachycardia is more related to discomfort.  CT confirms a 4 mm fairly proximal obstructing stone.  Still is quite uncomfortable at the time of signout as we are waiting for pharmacy to send IV lidocaine to hopefully get control of her pain  and possibly passed the stone in the ED.  If not, she will require admission.  Clinical Course as of 03/19/22 0700  Sun Mar 19, 2022  0637 Reassessed and updated mother and patient of CT results.  She is still quite uncomfortable despite multiple rounds of fentanyl, Toradol and antiemetics.  We will try IV lidocaine [DS]    Clinical Course User Index [DS] Vladimir Crofts, MD     FINAL CLINICAL IMPRESSION(S) / ED DIAGNOSES   Final diagnoses:  Ureterolithiasis  Left flank pain     Rx / DC Orders   ED Discharge Orders     None        Note:  This document was prepared using Dragon voice recognition software and may include unintentional dictation errors.   Vladimir Crofts, MD 03/19/22 [id number]

## 2022-03-19 NOTE — ED Triage Notes (Addendum)
Pt c/o sharp shooting L flank pain x 1 hr and emesis with all position changes. Pt states prev hospitalization with kidney stones in the past.

## 2022-03-20 ENCOUNTER — Other Ambulatory Visit: Payer: Self-pay | Admitting: Physician Assistant

## 2022-03-20 ENCOUNTER — Other Ambulatory Visit: Payer: Self-pay | Admitting: *Deleted

## 2022-03-20 ENCOUNTER — Encounter: Payer: Self-pay | Admitting: Physician Assistant

## 2022-03-20 ENCOUNTER — Ambulatory Visit: Payer: BC Managed Care – PPO | Admitting: Physician Assistant

## 2022-03-20 ENCOUNTER — Ambulatory Visit
Admission: RE | Admit: 2022-03-20 | Discharge: 2022-03-20 | Disposition: A | Discharge status: Home / Self Care | Payer: BC Managed Care – PPO | Source: Ambulatory Visit | Attending: Physician Assistant | Admitting: Physician Assistant

## 2022-03-20 VITALS — BP 130/80 | HR 7 | Ht 65.0 in | Wt 188.0 lb

## 2022-03-20 DIAGNOSIS — N201 Calculus of ureter: Secondary | ICD-10-CM

## 2022-03-20 DIAGNOSIS — N23 Unspecified renal colic: Secondary | ICD-10-CM | POA: Diagnosis not present

## 2022-03-20 DIAGNOSIS — N200 Calculus of kidney: Secondary | ICD-10-CM | POA: Diagnosis present

## 2022-03-20 DIAGNOSIS — Z87442 Personal history of urinary calculi: Secondary | ICD-10-CM

## 2022-03-20 DIAGNOSIS — Z8489 Family history of other specified conditions: Secondary | ICD-10-CM | POA: Diagnosis not present

## 2022-03-20 LAB — MICROSCOPIC EXAMINATION

## 2022-03-20 LAB — URINALYSIS, COMPLETE
Bilirubin, UA: NEGATIVE
Glucose, UA: NEGATIVE
Ketones, UA: NEGATIVE
Nitrite, UA: NEGATIVE
Protein,UA: NEGATIVE
Specific Gravity, UA: 1.01 (ref 1.005–1.030)
Urobilinogen, Ur: 0.2 mg/dL (ref 0.2–1.0)
pH, UA: 6.5 (ref 5.0–7.5)

## 2022-03-20 MED ORDER — DIAZEPAM 2 MG PO TABS: ORAL_TABLET | ORAL | 0 refills | Status: DC

## 2022-03-20 MED ORDER — KETOROLAC TROMETHAMINE 60 MG/2ML IM SOLN
60.0000 mg | Freq: Once | INTRAMUSCULAR | Status: AC
  Administered 2022-03-20: 15 mg via INTRAMUSCULAR

## 2022-03-20 NOTE — Progress Notes (Unsigned)
03/20/2022 11:52 AM   Grace Mendoza 1995-03-14 [id number]  CC: Chief Complaint  Patient presents with   Nephrolithiasis   HPI: Grace Mendoza is a 27 y.o. female with PMH nephrolithiasis who presents today for ED follow-up of an acute stone episode.   She was seen in the ED yesterday with reports of acute onset left flank pain and emesis.  CT stone study revealed an obstructing 4 mm proximal left ureteral stone.  Bilateral punctate nonobstructing renal stones also noted, L>R.  UA was notable for microscopic hematuria and pyuria without nitrites or bacteriuria.  She had leukocytosis of 13.5, creatinine at baseline 0.72.  Today she reports her sharp left flank pain is persistent, though improved.  She is managing it at home with alternating ibuprofen and Tylenol.  She took an oxycodone, but it did not help.  She also reports some bothersome constipation.  Notably, she tolerated a ureteral stent well previously, however she reported significant discomfort with cystoscopic removal.  She does not think she would be able to remove her stent on her own.  She reports a family history of nephrolithiasis on her mom's side.  KUB today reveals a radiopaque 4 mm proximal left ureteral stone.  In-office UA today positive for trace intact blood and trace leukocytes; urine microscopy with 6-10 WBCs/HPF and moderate bacteria.   PMH: Past Medical History:  Diagnosis Date   Bronchitis    Family history of ovarian cancer    5/22 cancer genetic testing letter sent   Headache    migraines   Kidney stones     Surgical History: Past Surgical History:  Procedure Laterality Date   CYSTOSCOPY WITH STENT PLACEMENT Right 06/07/2020   Procedure: CYSTOSCOPY WITH STENT PLACEMENT;  Surgeon: Lucas Mallow, MD;  Location: [facility] ORS;  Service: Urology;  Laterality: Right;   PILONIDAL CYST EXCISION N/A 12/05/2017   Procedure: CYST EXCISION PILONIDAL EXTENSIVE;  Surgeon: Robert Bellow, MD;   Location: [facility] ORS;  Service: General;  Laterality: N/A;   TONSILLECTOMY  2004   URETEROSCOPY Right 06/07/2020   Procedure: DIAGNOSTIC URETEROSCOPY;  Surgeon: Lucas Mallow, MD;  Location: [facility] ORS;  Service: Urology;  Laterality: Right;    Home Medications:  Allergies as of 03/20/2022       Reactions   Bee Venom         Medication List        Accurate as of March 20, 2022 11:52 AM. If you have any questions, ask your nurse or doctor.          busPIRone 5 MG tablet Commonly known as: BUSPAR Take 1 tablet (5 mg total) by mouth 3 (three) times daily as needed (anxiety).   Nikki 3-0.02 MG tablet Generic drug: drospirenone-ethinyl estradiol Take 1 tablet by mouth daily.   ondansetron 4 MG disintegrating tablet Commonly known as: ZOFRAN-ODT Take 1 tablet (4 mg total) by mouth 2 (two) times daily for 30 doses.   oxyCODONE 5 MG immediate release tablet Commonly known as: Roxicodone Take 1 tablet (5 mg total) by mouth every 6 (six) hours as needed for up to 20 doses for severe pain or breakthrough pain.   tamsulosin 0.4 MG Caps capsule Commonly known as: FLOMAX Take 1 capsule (0.4 mg total) by mouth daily.        Allergies:  Allergies  Allergen Reactions   Bee Venom     Family History: Family History  Problem Relation Age of Onset   Ovarian cancer  Paternal Aunt 59    Social History:   reports that she has never smoked. She has never used smokeless tobacco. She reports that she does not drink alcohol and does not use drugs.  Physical Exam: BP 130/80   Pulse (!) 7   Ht '5\' 5"'$  (1.651 m)   Wt 188 lb (85.3 kg)   BMI 31.28 kg/m   Constitutional:  Alert and oriented, no acute distress, nontoxic appearing HEENT: [place], AT Cardiovascular: No clubbing, cyanosis, or edema Respiratory: Normal respiratory effort, no increased work of breathing Skin: No rashes, bruises or suspicious lesions Neurologic: Grossly intact, no focal deficits, moving all 4  extremities Psychiatric: Normal mood and affect  Laboratory Data: Results for orders placed or performed in visit on 03/20/22  Microscopic Examination   Urine  Result Value Ref Range   WBC, UA 6-10 (A) 0 - 5 /hpf   RBC, Urine 0-2 0 - 2 /hpf   Epithelial Cells (non renal) 0-10 0 - 10 /hpf   Bacteria, UA Moderate (A) None seen/Few  Urinalysis, Complete  Result Value Ref Range   Specific Gravity, UA 1.010 1.005 - 1.030   pH, UA 6.5 5.0 - 7.5   Color, UA Yellow Yellow   Appearance Ur Clear Clear   Leukocytes,UA Trace (A) Negative   Protein,UA Negative Negative/Trace   Glucose, UA Negative Negative   Ketones, UA Negative Negative   RBC, UA Trace (A) Negative   Bilirubin, UA Negative Negative   Urobilinogen, Ur 0.2 0.2 - 1.0 mg/dL   Nitrite, UA Negative Negative   Microscopic Examination See below:    Pertinent Imaging: KUB, 03/20/2022: CLINICAL DATA:  Left flank pain.  Known left kidney stone.   EXAM: ABDOMEN - 1 VIEW   COMPARISON:  CT 03/19/2022.   FINDINGS: Stool noted throughout the colon making evaluation for renal stone disease difficult. 4 mm stone again noted over the left upper ureter. Similar finding noted on recent CT. Previously noted tiny bilateral renal stones are not visualized on this study, most likely secondary to overlying bowel/stool. No bowel distention. No acute bony abnormality identified.   IMPRESSION: 4 mm stone again noted over the left upper ureter. Similar finding noted on recent CT.     Electronically Signed   By: Marcello Moores  Register M.D.   On: 03/21/2022 09:06  Results for orders placed during the hospital encounter of 03/19/22  CT Renal Stone Study  Narrative CLINICAL DATA:  27 year old female with history of left-sided flank pain. Emesis. Evaluate for kidney stone.  EXAM: CT ABDOMEN AND PELVIS WITHOUT CONTRAST  TECHNIQUE: Multidetector CT imaging of the abdomen and pelvis was performed following the standard protocol without IV  contrast.  RADIATION DOSE REDUCTION: This exam was performed according to the departmental dose-optimization program which includes automated exposure control, adjustment of the mA and/or kV according to patient size and/or use of iterative reconstruction technique.  COMPARISON:  CT of the abdomen and pelvis 06/05/2020.  FINDINGS: Lower chest: Unremarkable.  Hepatobiliary: No suspicious cystic or solid hepatic lesions are confidently identified on today's noncontrast CT examination. Unenhanced appearance of the gallbladder is unremarkable.  Pancreas: No definite pancreatic mass or peripancreatic fluid collections or inflammatory changes are noted on today's noncontrast CT examination.  Spleen: Unremarkable.  Adrenals/Urinary Tract: Multiple tiny nonobstructive calculi are noted within the collecting systems of both kidneys measuring 1-3 mm in size. In addition, in the proximal third of the left ureter at or immediately beyond the left ureteropelvic junction (coronal image 50 of  series 5) there is a 4 mm calculus which is associated with mild proximal left hydroureteronephrosis. No additional calculi are noted elsewhere along the course of either ureter or within the lumen of the urinary bladder. No right hydroureteronephrosis. Urinary bladder is normal in appearance.  Stomach/Bowel: The unenhanced appearance of the stomach is normal. No pathologic dilatation of small bowel or colon. Normal appendix.  Vascular/Lymphatic: No atherosclerotic calcifications are noted in the abdominal aorta or pelvic vasculature. No lymphadenopathy noted in the abdomen or pelvis.  Reproductive: Uterus and ovaries are unremarkable in appearance.  Other: No significant volume of ascites.  No pneumoperitoneum.  Musculoskeletal: There are no aggressive appearing lytic or blastic lesions noted in the visualized portions of the skeleton.  IMPRESSION: 1. 4 mm obstructive calculus in the proximal  third of the left ureter at or immediately beyond the left ureteropelvic junction with mild proximal left hydroureteronephrosis. Multiple additional nonobstructive 1-3 mm calculi are present in the collecting systems of both kidneys (left-greater-than-right).   Electronically Signed By: Vinnie Langton M.D. On: 03/19/2022 06:14  I personally reviewed the images referenced above and note a 4 mm proximal left ureteral stone.  Assessment & Plan:   1. Left ureteral stone Persistent left renal colic secondary to a 4 mm proximal left ureteral stone.  It is visible on KUB today.  UA with mild pyuria and bacteriuria, though low suspicion for UTI at this time.  Will send for preop urine culture regardless.  We discussed various treatment options for her stone including trial of passage vs. ESWL vs. ureteroscopy with laser lithotripsy and stent.  We specifically discussed that ESWL is a less invasive procedure that requires less anesthesia, however patients have to pass their residual stone fragments, which may take several weeks and be associated with continued renal colic. Additionally, we discussed the limitations of ESWL including the ability to only treat one stone in a single treatment and the low, 10-20% chance of treatment failure requiring repeat ESWL versus ureteroscopy in the future. By comparison, ureteroscopy is a more invasive surgical procedure that requires more anesthesia, but we can potentially treat multiple stones in a single procedure. However, URS does require placement of a ureteral stent, which will remain in place for approximately 3-10 days and can be associated with flank pain, bladder pain, dysuria, urgency, frequency, urinary leakage, and gross hematuria.  Based on this conversation, she would like to proceed with left ureteroscopy.  She prefers an office stent removal and I offered her a one-time dose of Valium for comfort.  Will plan for 3/15 with Dr. Diamantina Providence; possibility  of moving to 3/14 with Dr. Erlene Quan if schedule allows.  15 mg IM Toradol also administered in clinic today. - Urinalysis, Complete - CULTURE, URINE COMPREHENSIVE - ketorolac (TORADOL) injection 60 mg - diazepam (VALIUM) 2 MG tablet; Take one tablet by mouth 30-60 minutes prior to stent removal. Do not operate heavy machinery while on this medication.  Dispense: 1 tablet; Refill: 0  2. History of nephrolithiasis Family history of stones on her maternal side.  We discussed pursuing metabolic workup in the future and she agreed.  Will plan for this once we have treated her acute stone episode as above.  Return for Will call to schedule surgery.  Debroah Loop, PA-C  [facility] [address], [place], [place] [address] [phone]

## 2022-03-20 NOTE — Progress Notes (Unsigned)
Surgical Physician Order Form [facility] [place]  Dr. Diamantina Providence * Scheduling expectation :  03/24/2022  *Length of Case:   *Clearance needed: no  *Anticoagulation Instructions: N/A  *Aspirin Instructions: N/A  *Post-op visit Date/Instructions:  1 week cysto stent removal  *Diagnosis: Left Ureteral Stone  *Procedure: left Ureteroscopy w/laser lithotripsy & stent placement [id number])   Additional orders: N/A  -Admit type: OUTpatient  -Anesthesia: General  -VTE Prophylaxis Standing Order SCD's       Other:   -Standing Lab Orders Per Anesthesia    Lab other: None  -Standing Test orders EKG/Chest x-ray per Anesthesia       Test other:   - Medications:  Ancef 2gm IV  -Other orders:  N/A

## 2022-03-20 NOTE — Patient Instructions (Signed)
I'll call you once I speak with Dr. Erlene Quan and figure out timing for your ureteroscopy. In the meantime, please do the following: -Take Flomax 0.'4mg'$  daily -Stay well hydrated -Strain your urine to catch any stones that pass -Treat any pain with ibuprofen/tylenol or oxycodone  -Treat any nausea with Zofran   Please call our office immediately (we are open 8a-5p Monday-Friday) or go to the Emergency Department if you develop any of the following: -Fever/chills -Nausea and/or vomiting uncontrollable with Zofran -Pain uncontrollable with oxycodone

## 2022-03-21 ENCOUNTER — Telehealth: Payer: Self-pay

## 2022-03-21 DIAGNOSIS — N201 Calculus of ureter: Secondary | ICD-10-CM

## 2022-03-21 NOTE — Progress Notes (Signed)
   [facility] From  Surgery Date: Date: 03/24/2022  Surgeon: Dr. Nickolas Madrid, MD  Inpt ( No  )   Outpt (Yes)   Obs ( No  )   Diagnosis: N20.1 Left Ureteral  Stone  -CPT: [phone]  Surgery: Left Ureteroscopy with Laser Lithotripsy and Stent Placement   Stop Anticoagulations: No  Cardiac/Medical/Pulmonary Clearance needed: no  *Orders entered into EPIC  Date: 03/21/22   *Case booked in [place]  Date: 03/20/2022  *Notified pt of Surgery: Date: 03/20/2022  PRE-OP UA & CX: no  *Placed into Prior Authorization Work [place] Date: 03/21/22  Assistant/laser/rep:No

## 2022-03-21 NOTE — Telephone Encounter (Signed)
I spoke with Grace Mendoza. We have discussed possible surgery dates and Friday March 15th, 2024 was agreed upon by all parties. Patient given information about surgery date, what to expect pre-operatively and post operatively.  We discussed that a Pre-Admission Testing office will be calling to set up the pre-op visit that will take place prior to surgery, and that these appointments are typically done over the phone with a Pre-Admissions RN. Informed patient that our office will communicate any additional care to be provided after surgery. Patients questions or concerns were discussed during our call. Advised to call our office should there be any additional information, questions or concerns that arise. Patient verbalized understanding.

## 2022-03-22 ENCOUNTER — Other Ambulatory Visit: Payer: Self-pay | Admitting: *Deleted

## 2022-03-22 DIAGNOSIS — N200 Calculus of kidney: Secondary | ICD-10-CM

## 2022-03-23 ENCOUNTER — Encounter
Admission: RE | Admit: 2022-03-23 | Discharge: 2022-03-23 | Disposition: A | Discharge status: Home / Self Care | Payer: BC Managed Care – PPO | Source: Ambulatory Visit | Attending: Urology | Admitting: Urology

## 2022-03-23 ENCOUNTER — Ambulatory Visit
Admission: RE | Admit: 2022-03-23 | Discharge: 2022-03-23 | Disposition: A | Discharge status: Home / Self Care | Payer: BC Managed Care – PPO | Source: Ambulatory Visit | Attending: Physician Assistant | Admitting: Physician Assistant

## 2022-03-23 ENCOUNTER — Other Ambulatory Visit: Payer: Self-pay

## 2022-03-23 DIAGNOSIS — Z01812 Encounter for preprocedural laboratory examination: Secondary | ICD-10-CM | POA: Insufficient documentation

## 2022-03-23 DIAGNOSIS — N200 Calculus of kidney: Secondary | ICD-10-CM | POA: Insufficient documentation

## 2022-03-23 HISTORY — DX: Other specified postprocedural states: Z98.890

## 2022-03-23 HISTORY — DX: Anxiety disorder, unspecified: F41.9

## 2022-03-23 HISTORY — DX: Personal history of urinary calculi: Z87.442

## 2022-03-23 HISTORY — DX: Other complications of anesthesia, initial encounter: T88.59XA

## 2022-03-23 HISTORY — DX: Other specified postprocedural states: R11.2

## 2022-03-23 LAB — CULTURE, URINE COMPREHENSIVE

## 2022-03-23 NOTE — Patient Instructions (Signed)
Your procedure is scheduled on: 03/24/22 - Friday Report to the Registration Desk on the 1st floor of the [facility]. To find out your arrival time, please call [phone] between 1PM - 3PM on: 03/23/22 - Thursday If your arrival time is 6:00 am, do not arrive before that time as the [facility] entrance doors do not open until 6:00 am.  REMEMBER: Instructions that are not followed completely may result in serious medical risk, up to and including death; or upon the discretion of your surgeon and anesthesiologist your surgery may need to be rescheduled.  Do not eat food or drink any liquids after midnight the night before surgery.  No gum chewing or hard candies.   One week prior to surgery: Stop Anti-inflammatories (NSAIDS) such as Advil, Aleve, Ibuprofen, Motrin, Naproxen, Naprosyn and Aspirin based products such as Excedrin, Goody's Powder, BC Powder.  Stop ANY OVER THE COUNTER supplements until after surgery.  You may however, continue to take Tylenol if needed for pain up until the day of surgery.   TAKE ONLY THESE MEDICATIONS THE MORNING OF SURGERY WITH A SIP OF WATER:  busPIRone (BUSPAR)  NIKKI  oxyCODONE (ROXICODONE) if needed   No Alcohol for 24 hours before or after surgery.  No Smoking including e-cigarettes for 24 hours before surgery.  No chewable tobacco products for at least 6 hours before surgery.  No nicotine patches on the day of surgery.  Do not use any "recreational" drugs for at least a week (preferably 2 weeks) before your surgery.  Please be advised that the combination of cocaine and anesthesia may have negative outcomes, up to and including death. If you test positive for cocaine, your surgery will be cancelled.  On the morning of surgery brush your teeth with toothpaste and water, you may rinse your mouth with mouthwash if you wish. Do not swallow any toothpaste or mouthwash.  Do not wear jewelry, make-up, hairpins, clips or nail polish.  Do  not wear lotions, powders, or perfumes.   Do not shave body hair from the neck down 48 hours before surgery.  Contact lenses, hearing aids and dentures may not be worn into surgery.  Do not bring valuables to the hospital. [facility] is not responsible for any missing/lost belongings or valuables.   Notify your doctor if there is any change in your medical condition (cold, fever, infection).  Wear comfortable clothing (specific to your surgery type) to the hospital.  After surgery, you can help prevent lung complications by doing breathing exercises.  Take deep breaths and cough every 1-2 hours. Your doctor may order a device called an Incentive Spirometer to help you take deep breaths. When coughing or sneezing, hold a pillow firmly against your incision with both hands. This is called "splinting." Doing this helps protect your incision. It also decreases belly discomfort.  If you are being admitted to the hospital overnight, leave your suitcase in the car. After surgery it may be brought to your room.  In case of increased patient census, it may be necessary for you, the patient, to continue your postoperative care in the Same Day Surgery department.  If you are being discharged the day of surgery, you will not be allowed to drive home. You will need a responsible individual to drive you home and stay with you for 24 hours after surgery.   If you are taking public transportation, you will need to have a responsible individual with you.  Please call the [facility] Dept.  at [phone] if you have any questions about these instructions.  Surgery Visitation Policy:  Patients undergoing a surgery or procedure may have two family members or support persons with them as [place] as the person is not COVID-19 positive or experiencing its symptoms.   Inpatient Visitation:    Visiting hours are 7 a.m. to 8 p.m. Up to four visitors are allowed at one time in a patient room. The  visitors may rotate out with other people during the day. One designated support person (adult) may remain overnight.  Due to an increase in RSV and influenza rates and associated hospitalizations, children ages 30 and under will not be able to visit patients in [facility]. Masks continue to be strongly recommended.

## 2022-03-23 NOTE — Telephone Encounter (Signed)
Patient requested a repeat KUB prior to planned left ureteroscopy tomorrow due to improved symptoms.  Her KUB today shows interval migration of her left ureteral stone to the distal left ureter.  I spoke with the patient via telephone this afternoon and we discussed her results.  Based on her stones migration, she would like to cancel surgery tomorrow and continue with a trial of passage on Flomax.  Please cancel her surgery.  I've scheduled her for stone follow-up with me with UA and KUB prior in 2 weeks and cancelled planned postop stent removal.

## 2022-03-24 ENCOUNTER — Ambulatory Visit: Admission: RE | Admit: 2022-03-24 | Payer: BC Managed Care – PPO | Source: Home / Self Care | Admitting: Urology

## 2022-03-24 ENCOUNTER — Encounter: Admission: RE | Payer: Self-pay | Source: Home / Self Care

## 2022-03-24 SURGERY — CYSTOSCOPY/URETEROSCOPY/HOLMIUM LASER/STENT PLACEMENT
Anesthesia: General | Laterality: Left

## 2022-03-28 MED ORDER — TAMSULOSIN HCL 0.4 MG PO CAPS: 0.4000 mg | ORAL_CAPSULE | Freq: Every day | ORAL | 0 refills | Status: AC

## 2022-04-05 ENCOUNTER — Encounter: Payer: BC Managed Care – PPO | Admitting: Urology

## 2022-04-06 ENCOUNTER — Ambulatory Visit: Payer: BC Managed Care – PPO | Admitting: Physician Assistant

## 2022-04-06 ENCOUNTER — Ambulatory Visit
Admission: RE | Admit: 2022-04-06 | Discharge: 2022-04-06 | Disposition: A | Discharge status: Home / Self Care | Payer: BC Managed Care – PPO | Source: Ambulatory Visit | Attending: Physician Assistant | Admitting: Physician Assistant

## 2022-04-06 VITALS — BP 104/71 | HR 103 | Ht 65.0 in | Wt 186.2 lb

## 2022-04-06 DIAGNOSIS — N201 Calculus of ureter: Secondary | ICD-10-CM | POA: Insufficient documentation

## 2022-04-06 DIAGNOSIS — Z87442 Personal history of urinary calculi: Secondary | ICD-10-CM | POA: Diagnosis not present

## 2022-04-06 NOTE — Progress Notes (Signed)
04/06/2022 1:18 PM   Grace Mendoza Jul 01, 1995 [id number]  CC: Chief Complaint  Patient presents with   Nephrolithiasis    Follow up   HPI: Grace Mendoza is a 27 y.o. female with PMH nephrolithiasis who presents today for follow-up of a 4 mm left ureteral stone.   She was originally scheduled for ESWL, but elected to defer this in favor of a trial of passage in the setting of distal migration of the stone after her most recent office visit.  Today she reports acute worsening in her left flank pain with new dysuria that lasted about 5 hours occurring 1 week ago.  By the following morning, her dysuria and pain had resolved.  She has had no further pain, never saw stone pass, but overall feels much better and thinks she passed the stone.  KUB today with interval resolution of the distal left ureteral stone.  In-office UA today positive for trace intact blood and trace leukocytes; urine microscopy with 6-10 WBCs/HPF.  PMH: Past Medical History:  Diagnosis Date   Anxiety    Bronchitis    Complication of anesthesia    was not breathing after surgery for a short time, due to spasms.   Family history of ovarian cancer    5/22 cancer genetic testing letter sent   Headache    migraines   History of kidney stones    Hx MRSA infection 2019   Kidney stones    PONV (postoperative nausea and vomiting)     Surgical History: Past Surgical History:  Procedure Laterality Date   CYSTOSCOPY WITH STENT PLACEMENT Right 06/07/2020   Procedure: CYSTOSCOPY WITH STENT PLACEMENT;  Surgeon: Lucas Mallow, MD;  Location: [facility] ORS;  Service: Urology;  Laterality: Right;   PILONIDAL CYST EXCISION N/A 12/05/2017   Procedure: CYST EXCISION PILONIDAL EXTENSIVE;  Surgeon: Robert Bellow, MD;  Location: [facility] ORS;  Service: General;  Laterality: N/A;   TONSILLECTOMY  2004   URETEROSCOPY Right 06/07/2020   Procedure: DIAGNOSTIC URETEROSCOPY;  Surgeon: Lucas Mallow, MD;  Location:  [facility] ORS;  Service: Urology;  Laterality: Right;   WISDOM TOOTH EXTRACTION  2021    Home Medications:  Allergies as of 04/06/2022       Reactions   Bee Venom         Medication List        Accurate as of April 06, 2022  1:18 PM. If you have any questions, ask your nurse or doctor.          STOP taking these medications    diazepam 2 MG tablet Commonly known as: Valium   ibuprofen 400 MG tablet Commonly known as: ADVIL   oxyCODONE 5 MG immediate release tablet Commonly known as: Roxicodone   Tylenol 8 Hour 650 MG CR tablet Generic drug: acetaminophen       TAKE these medications    busPIRone 5 MG tablet Commonly known as: BUSPAR Take 1 tablet (5 mg total) by mouth 3 (three) times daily as needed (anxiety).   docusate sodium 100 MG capsule Commonly known as: COLACE Take 300 mg by mouth daily.   Nikki 3-0.02 MG tablet Generic drug: drospirenone-ethinyl estradiol Take 1 tablet by mouth daily.   tamsulosin 0.4 MG Caps capsule Commonly known as: FLOMAX Take 1 capsule (0.4 mg total) by mouth daily.        Allergies:  Allergies  Allergen Reactions   Bee Venom     Family History: Family History  Problem Relation Age of Onset   Ovarian cancer Paternal Aunt 72    Social History:   reports that she has never smoked. She has never used smokeless tobacco. She reports current alcohol use. She reports that she does not use drugs.  Physical Exam: BP 104/71   Pulse (!) 103   Ht 5\' 5"  (1.651 m)   Wt 186 lb 4 oz (84.5 kg)   LMP 03/06/2022 (Approximate) Comment: neg preg test on 03/19/22  BMI 30.99 kg/m   Constitutional:  Alert and oriented, no acute distress, nontoxic appearing HEENT: [place], AT Cardiovascular: No clubbing, cyanosis, or edema Respiratory: Normal respiratory effort, no increased work of breathing Skin: No rashes, bruises or suspicious lesions Neurologic: Grossly intact, no focal deficits, moving all 4 extremities Psychiatric: Normal  mood and affect  Laboratory Data: Results for orders placed or performed in visit on 03/20/22  CULTURE, URINE COMPREHENSIVE   Specimen: Urine   UR  Result Value Ref Range   Urine Culture, Comprehensive Final report    Organism ID, Bacteria Comment    Organism ID, Bacteria Not applicable   Microscopic Examination   Urine  Result Value Ref Range   WBC, UA 6-10 (A) 0 - 5 /hpf   RBC, Urine 0-2 0 - 2 /hpf   Epithelial Cells (non renal) 0-10 0 - 10 /hpf   Bacteria, UA Moderate (A) None seen/Few  Urinalysis, Complete  Result Value Ref Range   Specific Gravity, UA 1.010 1.005 - 1.030   pH, UA 6.5 5.0 - 7.5   Color, UA Yellow Yellow   Appearance Ur Clear Clear   Leukocytes,UA Trace (A) Negative   Protein,UA Negative Negative/Trace   Glucose, UA Negative Negative   Ketones, UA Negative Negative   RBC, UA Trace (A) Negative   Bilirubin, UA Negative Negative   Urobilinogen, Ur 0.2 0.2 - 1.0 mg/dL   Nitrite, UA Negative Negative   Microscopic Examination See below:    Pertinent Imaging: Results for orders placed during the hospital encounter of 04/06/22  DG Abd 1 View  Narrative CLINICAL DATA:  Left ureteral stone.  EXAM: ABDOMEN - 1 VIEW  COMPARISON:  Abdominal radiograph 03/23/2022  FINDINGS: Lung bases are clear. There are a few punctate stones within the left kidney measuring up to 3 mm within the inferior pole. No definite right nephrolithiasis identified. Paucity of bowel gas. Osseous structures unremarkable.  IMPRESSION: 3 mm left inferior pole renal stones. No definite right nephrolithiasis.   Electronically Signed By: Lovey Newcomer M.D. On: 04/06/2022 13:12  I personally reviewed the images referenced above and note interval resolution of the distal left ureteral stone.  Assessment & Plan:   1. Left ureteral stone Symptoms resolved, UA with only mild pyuria, and interval resolution of the stone on KUB today.  Suspect spontaneous passage.  We discussed  return precautions including return of her flank pain or gross hematuria.  She continues to wish to pursue Litholink, which is reasonable.  Will order this today and plan to see her back in clinic in about 2 months to review results.  I asked her to refrain from collecting her specimens for an additional 3 weeks to allow 1 month to pass from her most recent stone episode. - Urinalysis, Complete - Litholink 24Hr Urine Panel - Litholink Serum Panel; Future    Return in about 8 weeks (around 06/01/2022) for Litholink results.  Debroah Loop, PA-C  [facility]  [address], [place], [place] [address] [phone]    

## 2022-04-06 NOTE — Patient Instructions (Signed)

## 2022-04-07 LAB — URINALYSIS, COMPLETE
Bilirubin, UA: NEGATIVE
Glucose, UA: NEGATIVE
Ketones, UA: NEGATIVE
Nitrite, UA: NEGATIVE
Protein,UA: NEGATIVE
Specific Gravity, UA: 1.01 (ref 1.005–1.030)
Urobilinogen, Ur: 0.2 mg/dL (ref 0.2–1.0)
pH, UA: 6.5 (ref 5.0–7.5)

## 2022-04-07 LAB — MICROSCOPIC EXAMINATION

## 2022-04-20 ENCOUNTER — Other Ambulatory Visit: Payer: Self-pay | Admitting: Physician Assistant

## 2022-04-29 ENCOUNTER — Other Ambulatory Visit: Payer: Self-pay | Admitting: Obstetrics and Gynecology

## 2022-04-29 DIAGNOSIS — Z3041 Encounter for surveillance of contraceptive pills: Secondary | ICD-10-CM

## 2022-05-01 ENCOUNTER — Telehealth: Payer: Self-pay

## 2022-05-01 DIAGNOSIS — Z3041 Encounter for surveillance of contraceptive pills: Secondary | ICD-10-CM

## 2022-05-01 MED ORDER — NIKKI 3-0.02 MG PO TABS: 1.0000 | ORAL_TABLET | Freq: Every day | ORAL | 0 refills | Status: DC

## 2022-05-01 NOTE — Telephone Encounter (Signed)
Grace Mendoza called and stated she needed one refill to hold her over until her appointment in the middle of May. I sent in one refill

## 2022-05-10 DIAGNOSIS — Z1589 Genetic susceptibility to other disease: Secondary | ICD-10-CM

## 2022-05-10 DIAGNOSIS — Z1371 Encounter for nonprocreative screening for genetic disease carrier status: Secondary | ICD-10-CM

## 2022-05-10 HISTORY — DX: Genetic susceptibility to other disease: Z15.89

## 2022-05-10 HISTORY — DX: Encounter for nonprocreative screening for genetic disease carrier status: Z13.71

## 2022-05-22 ENCOUNTER — Other Ambulatory Visit: Payer: Self-pay | Admitting: Obstetrics and Gynecology

## 2022-05-22 DIAGNOSIS — Z3041 Encounter for surveillance of contraceptive pills: Secondary | ICD-10-CM

## 2022-05-22 NOTE — Telephone Encounter (Signed)
Pt will receive additional refills, for a year after annual visit.

## 2022-05-24 ENCOUNTER — Telehealth: Payer: Self-pay | Admitting: Physician Assistant

## 2022-05-24 NOTE — Telephone Encounter (Signed)
Pt LMOM that she hasn't received her Litholink kit yet and has upcoming appt w/Sam on 5/24.

## 2022-05-25 ENCOUNTER — Other Ambulatory Visit: Payer: Self-pay | Admitting: *Deleted

## 2022-05-25 DIAGNOSIS — Z87442 Personal history of urinary calculi: Secondary | ICD-10-CM

## 2022-05-25 NOTE — Telephone Encounter (Signed)
Let detailed message on VM, advising pt we will be moving her appointment and that a new kit will be sent out per [facility].

## 2022-05-25 NOTE — Telephone Encounter (Signed)
Re-entered order and [facility] stated it should be sent out in a few days.  Sam do you want to reschedule or keep appointment?

## 2022-05-25 NOTE — Telephone Encounter (Signed)
Let's push appt out 4-6 weeks.

## 2022-05-31 NOTE — Progress Notes (Unsigned)
PCP:  Rica Records, PA-C   No chief complaint on file.    HPI:      [name] is a 27 y.o. G0P0000 whose LMP was No LMP recorded. (Menstrual status: Oral contraceptives)., presents today for her annual examination.  Her menses are {norm/abn:715}, lasting {number: 22536} days.  Dysmenorrhea {dysmen:716}. She {does:18564} have intermenstrual bleeding.  Sex activity: {sex active: 315163}.  Last Pap: 04/14/19 Results were: no abnormalities  Hx of STDs: {STD hx:14358}  There is no FH of breast cancer. There is a FH of ovarian cancer in her ***. The patient {does:18564} do self-breast exams.  Tobacco use: {tob:20664} Alcohol use: {Alcohol:11675} No drug use.  Exercise: {exercise:31265}  She {does:18564} get adequate calcium and Vitamin D in her diet. On buspar for anxiety  Patient Active Problem List   Diagnosis Date Noted   Pyelonephritis 06/06/2020   Acute pyelonephritis 06/05/2020   Hydronephrosis with renal and ureteral calculus obstruction 06/05/2020   Pilonidal cyst 07/20/2016    Past Surgical History:  Procedure Laterality Date   CYSTOSCOPY WITH STENT PLACEMENT Right 06/07/2020   Procedure: CYSTOSCOPY WITH STENT PLACEMENT;  Surgeon: Crista Elliot, MD;  Location: [facility] OR[facility];  Service: Urology;  Laterality: Right;   PILONIDAL CYST EXCISION N/A 12/05/2017   Procedure: CYST EXCISION PILONIDAL EXTENSIVE;  Surgeon: Earline Mayotte, MD;  Location: [facility];  Service: General;  Laterality: N/A;   TONSILLECTOMY  2004   URETEROSCOPY Right 06/07/2020   Procedure: DIAGNOSTIC URETEROSCOPY;  Surgeon: Crista Elliot, MD;  Location: [facility] OR[facility];  Service: Urology;  Laterality: Right;   WISDOM TOOTH EXTRACTION  2021    Family History  Problem Relation Age of Onset   Ovarian cancer Paternal Aunt 17    Social History   Socioeconomic History   Marital status: Single    Spouse name: Not on file   Number of children: Not on file   Years of education: Not on  file   Highest education level: Not on file  Occupational History   Not on file  Tobacco Use   Smoking status: Never   Smokeless tobacco: Never  Vaping Use   Vaping Use: Never used  Substance and Sexual Activity   Alcohol use: Yes    Comment: 1 week   Drug use: No   Sexual activity: Yes    Birth control/protection: Pill  Other Topics Concern   Not on file  Social History Narrative   Not on file   Social Determinants of Health   Financial Resource Strain: Not on file  Food Insecurity: Not on file  Transportation Needs: Not on file  Physical Activity: Not on file  Stress: Not on file  Social Connections: Not on file  Intimate Partner Violence: Not on file     Current Outpatient Medications:    busPIRone (BUSPAR) 5 MG tablet, Take 1 tablet (5 mg total) by mouth 3 (three) times daily as needed (anxiety)., Disp: 90 tablet, Rfl: 3   docusate sodium (COLACE) 100 MG capsule, Take 300 mg by mouth daily., Disp: , Rfl:    NIKKI 3-0.02 MG tablet, TAKE 1 TABLET BY MOUTH EVERY DAY, Disp: 84 tablet, Rfl: 0     ROS:  Review of Systems BREAST: No symptoms   Objective: There were no vitals taken for this visit.   OBGyn Exam  Results: No results found for this or any previous visit (from the past 24 hour(s)).  Assessment/Plan: No diagnosis found.  No orders  of the defined types were placed in this encounter.            GYN counsel {counseling: 16159}     F/U  No follow-ups on file.  Favor Kreh B. Evens Meno, PA-C 05/31/2022 9:14 PM

## 2022-06-01 ENCOUNTER — Other Ambulatory Visit (HOSPITAL_COMMUNITY)
Admission: RE | Admit: 2022-06-01 | Discharge: 2022-06-01 | Disposition: A | Discharge status: Home / Self Care | Payer: BC Managed Care – PPO | Source: Ambulatory Visit | Attending: Obstetrics and Gynecology | Admitting: Obstetrics and Gynecology

## 2022-06-01 ENCOUNTER — Ambulatory Visit (INDEPENDENT_AMBULATORY_CARE_PROVIDER_SITE_OTHER): Payer: BC Managed Care – PPO | Admitting: Obstetrics and Gynecology

## 2022-06-01 ENCOUNTER — Encounter: Payer: Self-pay | Admitting: Obstetrics and Gynecology

## 2022-06-01 VITALS — BP 110/70 | Ht 64.5 in | Wt 189.0 lb

## 2022-06-01 DIAGNOSIS — Z113 Encounter for screening for infections with a predominantly sexual mode of transmission: Secondary | ICD-10-CM | POA: Insufficient documentation

## 2022-06-01 DIAGNOSIS — F411 Generalized anxiety disorder: Secondary | ICD-10-CM | POA: Insufficient documentation

## 2022-06-01 DIAGNOSIS — Z124 Encounter for screening for malignant neoplasm of cervix: Secondary | ICD-10-CM | POA: Insufficient documentation

## 2022-06-01 DIAGNOSIS — F419 Anxiety disorder, unspecified: Secondary | ICD-10-CM

## 2022-06-01 DIAGNOSIS — Z8041 Family history of malignant neoplasm of ovary: Secondary | ICD-10-CM | POA: Insufficient documentation

## 2022-06-01 DIAGNOSIS — Z3041 Encounter for surveillance of contraceptive pills: Secondary | ICD-10-CM

## 2022-06-01 DIAGNOSIS — Z01419 Encounter for gynecological examination (general) (routine) without abnormal findings: Secondary | ICD-10-CM

## 2022-06-01 MED ORDER — NIKKI 3-0.02 MG PO TABS: 1.0000 | ORAL_TABLET | Freq: Every day | ORAL | 3 refills | Status: DC

## 2022-06-01 MED ORDER — BUSPIRONE HCL 5 MG PO TABS: ORAL_TABLET | ORAL | 3 refills | Status: DC

## 2022-06-01 NOTE — Patient Instructions (Signed)
I value your feedback and you entrusting us with your care. If you get a [facility] patient survey, I would appreciate you taking the time to let us know about your experience today. Thank you! ? ? ?

## 2022-06-02 ENCOUNTER — Ambulatory Visit: Payer: BC Managed Care – PPO | Admitting: Physician Assistant

## 2022-06-07 LAB — CYTOLOGY - PAP
Chlamydia: NEGATIVE
Comment: NEGATIVE
Comment: NORMAL
Diagnosis: NEGATIVE
Neisseria Gonorrhea: NEGATIVE

## 2022-06-11 ENCOUNTER — Encounter: Payer: Self-pay | Admitting: Obstetrics and Gynecology

## 2022-06-14 ENCOUNTER — Telehealth: Payer: Self-pay

## 2022-06-14 NOTE — Telephone Encounter (Signed)
Ariel from [employer] to f/u on fax that was sent on 5/30th.  [phone]

## 2022-06-19 ENCOUNTER — Telehealth: Payer: Self-pay

## 2022-06-19 ENCOUNTER — Encounter: Payer: Self-pay | Admitting: Obstetrics and Gynecology

## 2022-06-19 ENCOUNTER — Encounter: Payer: Self-pay | Admitting: *Deleted

## 2022-06-19 NOTE — Telephone Encounter (Signed)
Called left on triage line  Pt states she received her litholink kit. She wants to verify instructions. She states she has an appt on 6/28. Will she get her blood work at that appt?   Ok to leave detailed message on cell.

## 2022-06-19 NOTE — Telephone Encounter (Signed)
Left detailed VM that instructions have been sent to her mychart, advised pt to call back if any questions.

## 2022-06-21 NOTE — Telephone Encounter (Signed)
Spoke to a [employer] rep. Advised I haven't received fax for this pt. I was told fax was resent yesterday (I still haven't received anything as of now).

## 2022-06-22 ENCOUNTER — Telehealth: Payer: Self-pay | Admitting: Obstetrics and Gynecology

## 2022-06-22 NOTE — Telephone Encounter (Signed)
Patient was returning your call, and would like a call back.  Also she stated that if she doesn't answer it is because she has bad reception(She is traveling).

## 2022-06-26 ENCOUNTER — Encounter: Payer: Self-pay | Admitting: Obstetrics and Gynecology

## 2022-06-26 ENCOUNTER — Telehealth: Payer: Self-pay | Admitting: Obstetrics and Gynecology

## 2022-06-26 NOTE — Telephone Encounter (Signed)
Done on separate note

## 2022-06-26 NOTE — Telephone Encounter (Signed)
Pt aware of MUTYH monoallelic mutation on [facility] My[facility] testing. No increased screening at this time. IBIS=15.2%/riskscore=18.4%. Will cont to watch FH.  Patient understands these results only apply to her and her children, and this is not indicative of genetic testing results of her other family members. It is recommended that her other family members have genetic testing done.  Pt also understands negative genetic testing doesn't mean she will never get any of these cancers.   Hard copy mailed to pt. F/u prn.

## 2022-06-29 ENCOUNTER — Other Ambulatory Visit: Payer: Self-pay | Admitting: Obstetrics and Gynecology

## 2022-06-29 DIAGNOSIS — F411 Generalized anxiety disorder: Secondary | ICD-10-CM

## 2022-07-07 ENCOUNTER — Ambulatory Visit: Payer: BC Managed Care – PPO | Admitting: Physician Assistant

## 2022-07-27 ENCOUNTER — Other Ambulatory Visit: Payer: Self-pay | Admitting: Obstetrics and Gynecology

## 2022-07-27 DIAGNOSIS — F411 Generalized anxiety disorder: Secondary | ICD-10-CM

## 2022-09-04 ENCOUNTER — Other Ambulatory Visit: Payer: BC Managed Care – PPO

## 2022-09-04 DIAGNOSIS — N201 Calculus of ureter: Secondary | ICD-10-CM

## 2022-09-05 LAB — LITHOLINK SERUM PANEL
CO2: 22 mmol/L (ref 20–29)
Calcium: 9 mg/dL (ref 8.7–10.2)
Chloride: 103 mmol/L (ref 96–106)
Creatinine, Ser: 0.75 mg/dL (ref 0.57–1.00)
Magnesium: 1.9 mg/dL (ref 1.6–2.3)
Phosphorus: 3.1 mg/dL (ref 3.0–4.3)
Potassium: 4.4 mmol/L (ref 3.5–5.2)
Sodium: 138 mmol/L (ref 134–144)
Uric Acid: 2.8 mg/dL (ref 2.6–6.2)
eGFR: 114 mL/min/{1.73_m2} (ref >59)

## 2022-09-08 LAB — LITHOLINK 24HR URINE PANEL
Calcium Phosphate Saturation: 0.83 (ref 0.50–2.00)
Chloride, Urine: 173 mmol/24 hr (ref 70–250)
Cystine, Urine, Qualitative: NEGATIVE
Phosphorus, Urine: 1048 mg/24 hr (ref 600–1200)

## 2022-09-14 ENCOUNTER — Telehealth: Payer: Self-pay

## 2022-09-14 NOTE — Telephone Encounter (Signed)
Pt states she will call us to schedule a LithoLink results visit with Carman Ching, PA.

## 2022-09-15 DIAGNOSIS — R109 Unspecified abdominal pain: Secondary | ICD-10-CM

## 2022-09-15 MED ORDER — TAMSULOSIN HCL 0.4 MG PO CAPS: 0.4000 mg | ORAL_CAPSULE | Freq: Every day | ORAL | 3 refills | Status: AC

## 2022-09-19 ENCOUNTER — Ambulatory Visit: Payer: BC Managed Care – PPO | Admitting: Physician Assistant

## 2022-09-22 ENCOUNTER — Ambulatory Visit
Admission: RE | Admit: 2022-09-22 | Discharge: 2022-09-22 | Disposition: A | Discharge status: Home / Self Care | Payer: BC Managed Care – PPO | Source: Ambulatory Visit | Attending: Physician Assistant | Admitting: Physician Assistant

## 2022-09-22 ENCOUNTER — Encounter: Payer: Self-pay | Admitting: Physician Assistant

## 2022-09-22 ENCOUNTER — Ambulatory Visit: Payer: BC Managed Care – PPO | Admitting: Physician Assistant

## 2022-09-22 ENCOUNTER — Other Ambulatory Visit: Payer: Self-pay | Admitting: Physician Assistant

## 2022-09-22 VITALS — BP 135/81 | HR 112 | Ht 64.0 in | Wt 190.0 lb

## 2022-09-22 DIAGNOSIS — R109 Unspecified abdominal pain: Secondary | ICD-10-CM | POA: Diagnosis not present

## 2022-09-22 DIAGNOSIS — R82998 Other abnormal findings in urine: Secondary | ICD-10-CM

## 2022-09-22 DIAGNOSIS — Z87442 Personal history of urinary calculi: Secondary | ICD-10-CM | POA: Diagnosis not present

## 2022-09-22 LAB — URINALYSIS, COMPLETE
Bilirubin, UA: NEGATIVE
Glucose, UA: NEGATIVE
Ketones, UA: NEGATIVE
Leukocytes,UA: NEGATIVE
Nitrite, UA: NEGATIVE
Protein,UA: NEGATIVE
RBC, UA: NEGATIVE
Specific Gravity, UA: <1.005 — ABNORMAL LOW (ref 1.005–1.030)
Urobilinogen, Ur: 0.2 mg/dL (ref 0.2–1.0)
pH, UA: 6 (ref 5.0–7.5)

## 2022-09-22 LAB — MICROSCOPIC EXAMINATION
Bacteria, UA: NONE SEEN
RBC, Urine: NONE SEEN /HPF (ref 0–2)

## 2022-09-22 MED ORDER — POTASSIUM CITRATE ER 10 MEQ (1080 MG) PO TBCR: 20.0000 meq | EXTENDED_RELEASE_TABLET | Freq: Two times a day (BID) | ORAL | 5 refills | Status: AC

## 2022-09-22 NOTE — Progress Notes (Unsigned)
09/22/2022 1:14 PM   Grace Mendoza [birth year] [id number]  CC: Chief Complaint  Patient presents with   Nephrolithiasis   HPI: Grace Mendoza is a 27 y.o. female with PMH nephrolithiasis who presents today for evaluation of a possible acute stone episode.  She reached out to me on MyChart last week with reports of twinges of back pain concerning for stone episode.  I started her on Flomax empirically.  Today she reports she is feeling better, but taking pain meds 3 times daily so she is unsure if this is why she is doing better.  Pain has been bilateral, L>R.  She has been pushing fluids and denies fever, chills, nausea, vomiting, and gross hematuria.  KUB today with no radiopaque ureteral stones.  She also recently completed Litholink.  Pertinent results include borderline hyperoxaluria, mild hypocitraturia, high urine pH, and severe hyperuricosuria.  In-office UA and microscopy today pan negative.  PMH: Past Medical History:  Diagnosis Date   Anxiety    BRCA negative 05/2022   [employer] BRCA neg; IBIS=15.2%/riskscore=18.4%   Bronchitis    Complication of anesthesia    was not breathing after surgery for a short time, due to spasms.   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   [employer] MyRisk; pt is carrier (no increased screening recommendations for pt)   PONV (postoperative nausea and vomiting)    Vaccine for human papilloma virus (HPV) types 6, 11, 16, and 18 administered     Surgical History: Past Surgical History:  Procedure Laterality Date   CYSTOSCOPY WITH STENT PLACEMENT Right 06/07/2020   Procedure: CYSTOSCOPY WITH STENT PLACEMENT;  Surgeon: Crista Elliot, MD;  Location: [facility] OR[facility];  Service: Urology;  Laterality: Right;   PILONIDAL CYST EXCISION N/A 12/05/2017   Procedure:  CYST EXCISION PILONIDAL EXTENSIVE;  Surgeon: Earline Mayotte, MD;  Location: [facility] OR[facility];  Service: General;  Laterality: N/A;   TONSILLECTOMY  2004   URETEROSCOPY Right 06/07/2020   Procedure: DIAGNOSTIC URETEROSCOPY;  Surgeon: Crista Elliot, MD;  Location: [facility];  Service: Urology;  Laterality: Right;   WISDOM TOOTH EXTRACTION  2021    Home Medications:  Allergies as of 09/22/2022       Reactions   Bee Venom         Medication List        Accurate as of September 22, 2022  1:14 PM. If you have any questions, ask your nurse or doctor.          busPIRone 5 MG tablet Commonly known as: BUSPAR Take 1 tab QD to BID prn sx   Nikki 3-0.02 MG tablet Generic drug: drospirenone-ethinyl estradiol Take 1 tablet by mouth daily.   tamsulosin 0.4 MG Caps capsule Commonly known as: FLOMAX Take 1 capsule (0.4 mg total) by mouth daily.        Allergies:  Allergies  Allergen Reactions   Bee Venom     Family History: Family History  Problem Relation Age of Onset   Ovarian cancer Paternal Aunt 46    Social History:   reports that she has never smoked. She has never used smokeless tobacco. She reports current alcohol use. She reports that she does not use drugs.  Physical Exam: BP 135/81  Pulse (!) 112   Ht 5\' 4"  (1.626 m)   Wt 190 lb (86.2 kg)   BMI 32.61 kg/m   Constitutional:  Alert and oriented, no acute distress, nontoxic appearing HEENT: [place], AT Cardiovascular: No clubbing, cyanosis, or edema Respiratory: Normal respiratory effort, no increased work of breathing Skin: No rashes, bruises or suspicious lesions Neurologic: Grossly intact, no focal deficits, moving all 4 extremities Psychiatric: Normal mood and affect  Laboratory Data: Results for orders placed or performed in visit on 09/22/22  Microscopic Examination   Urine  Result Value Ref Range   WBC, UA 0-5 0 - 5 /hpf   RBC, Urine None seen 0 - 2 /hpf   Epithelial Cells (non renal) 0-10 0 -  10 /hpf   Bacteria, UA None seen None seen/Few  Urinalysis, Complete  Result Value Ref Range   Specific Gravity, UA <1.005 (L) 1.005 - 1.030   pH, UA 6.0 5.0 - 7.5   Color, UA Yellow Yellow   Appearance Ur Clear Clear   Leukocytes,UA Negative Negative   Protein,UA Negative Negative/Trace   Glucose, UA Negative Negative   Ketones, UA Negative Negative   RBC, UA Negative Negative   Bilirubin, UA Negative Negative   Urobilinogen, Ur 0.2 0.2 - 1.0 mg/dL   Nitrite, UA Negative Negative   Microscopic Examination See below:    Pertinent Imaging: KUB, 09/22/2022: CLINICAL DATA:  Flank pain, kidney stones.   EXAM: ABDOMEN - 1 VIEW   COMPARISON:  03/20/2022.   FINDINGS: The bowel gas pattern is normal. There are a few calcifications overlying the left kidney lower pole measuring 2-3 mm consistent with stones similar to prior study.   IMPRESSION: Left-sided nephrolithiasis.  Unremarkable bowel gas pattern.     Electronically Signed   By: Layla Maw M.D.   On: 09/26/2022 19:08  I personally reviewed the images referenced above and note no radiopaque ureteral stones.  Assessment & Plan:   1. Flank pain with history of urolithiasis UA bland, no ureteral stone seen on KUB.  Low suspicion for acute stone episode, though I offered her a renal ultrasound for further evaluation.  She is going to stop her pain meds and if her symptoms worsen, she will reach out via MyChart and we can pursue additional imaging. - Urinalysis, Complete  2. High urine pH level I made several recommendations for stone prevention in light of her Litholink results as above, namely: Decrease dietary oxalate Maintain moderate dietary calcium Decrease animal protein Continue to stay well-hydrated Start potassium citrate She is in agreement with this plan.  Will recheck a BMP in a month on potassium citrate and repeat Litholink in 6 months.  I gave her my oxalate and calcium content resources today as  well as a copy of her Litholink report. - potassium citrate (UROCIT-K) 10 MEQ (1080 MG) SR tablet; Take 2 tablets (20 mEq total) by mouth 2 (two) times daily with a meal.  Dispense: 120 tablet; Refill: 5 - Basic metabolic panel; Future - Litholink 24Hr Urine Panel; Future - Litholink Serum Panel; Future   Return in about 6 months (around 03/22/2023) for Repeat LithoLink.  Carman Ching, PA-C  [facility] [place] [address] [place], [place] [address] [phone]

## 2022-09-22 NOTE — Patient Instructions (Addendum)
Stone prevention recommendations 1. Shoot for 80-100oz of fluid intake daily. The best fluids for stone prevention are water and lemonade (see #5 below); clear sodas (that contain citric acid) would also be an okay choice. Poor choices for stone prevention would include teas and dark or yellow sodas (that contain phosphoric acid). 2. Decrease dietary oxalate intake. Oxalate is a compound found in varying concentrations in many foods. Please see the printed reference I provided today. Overall, I recommend using this list to help you make smart decisions about how to eat foods that are high in oxalate, either by limiting your portion sizes of those foods and/or not eating multiple high-oxalate foods in the same meal. Additionally, there are multiple apps available for download on your phone geared toward kidney stone patients that contain libraries of oxalate content for common foods. I have several patients who've downloaded these and found them helpful as quick reference guides if you find yourself in the grocery store or a restaurant and want easy access to this information. 3. Maintain moderate dietary calcium intake, 1000-1200mg  daily, see printed reference from today. Typically this can be achieved through dietary intake alone (e.g. one cup of milk contains 300mg  of calcium), however some patients benefit from calcium supplements if they are not hitting this goal. Calcium helps to prevent stones by binding oxalate in your gut and preventing it from being absorbed into your bloodstream where it can ultimately be used to make stones. For this reason, one of my favorite tips for stone patients is to pair high-oxalate foods with high-calcium foods in the same meal. 4. Reduce poultry, fish, and meat protein to 0.8 to 1.3 gm/kg/day. 5. Start potassium citrate twice daily with meals. 4. Reduce dietary sodium intake. 5. Increase dietary citrate/citric acid, which is the acid found in citrus fruits. This helps to  dissolve existing stones and slow the growth of new stones. This is most achievable by switching your fluid of choice to lemonade, adding citrus wedges to your water, or using Crystal Light water additives to increase the citric acid content of your fluids while avoiding excess sugar.  Please let me know if you have any questions or concerns.  Carman Ching, PA-C

## 2022-10-20 ENCOUNTER — Other Ambulatory Visit: Payer: BC Managed Care – PPO

## 2023-06-05 NOTE — Progress Notes (Signed)
 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     HPI:      [name] is a 28 y.o. G0P0000 whose LMP was Patient's last menstrual period was 05/27/2023 (approximate)., presents today for her annual examination.  Her menses are regular every 28-30 days, lasting 4-5 days, mod flow.  Has occas BTB with stress/no late/missed pills; mild dysmen, no meds needed.   Sex activity: single partner, contraception - OCP (estrogen/progesterone). No pain/bleeding. Last Pap: 06/01/22 Results were: no abnormalities  Hx of STDs: none  There is no FH of breast cancer. There is a FH of ovarian cancer in her pat aunt, pt is MyRisk neg except MUTYH monoallelic mutation 5/24 (mixed studies re: colon cancer risk; no increased screening recommendations currently). IBIS=15.2%/riskscore=18.4%. The patient does do self-breast exams.  Tobacco use: The patient denies current or previous tobacco use. Alcohol use: social drinker No drug use.  Exercise: min active  She does get adequate calcium but not Vitamin D in her diet. Was on buspar  for anxiety, takes 1-2 times daily with sx control. Stopped 12/24 due to somnolence with driving. Feels she needs to restart. Doesn't have to drive as far now for work either.  Gardasil completed.   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   Family history of ovarian cancer 06/01/2022   GAD (generalized anxiety disorder) 06/01/2022   Pyelonephritis 06/06/2020   Acute pyelonephritis 06/05/2020   Hydronephrosis with renal and ureteral calculus obstruction 06/05/2020   Pilonidal cyst 07/20/2016    Past Surgical History:  Procedure Laterality Date   CYSTOSCOPY WITH STENT PLACEMENT Right 06/07/2020   Procedure: CYSTOSCOPY WITH STENT PLACEMENT;  Surgeon: Samson Croak, MD;  Location: [facility]  ORS;  Service: Urology;  Laterality: Right;   PILONIDAL CYST EXCISION N/A 12/05/2017   Procedure: CYST EXCISION PILONIDAL EXTENSIVE;  Surgeon: Marshall Skeeter, MD;  Location: [facility] ORS;  Service: General;  Laterality: N/A;   TONSILLECTOMY  2004   URETEROSCOPY Right 06/07/2020   Procedure: DIAGNOSTIC URETEROSCOPY;  Surgeon: Samson Croak, MD;  Location: [facility] ORS;  Service: Urology;  Laterality: Right;   WISDOM TOOTH EXTRACTION  2021    Family History  Problem Relation Age of Onset   Ovarian cancer Paternal Aunt 1    Social History   Socioeconomic History   Marital status: Single    Spouse name: Not on file   Number of children: Not on file   Years of education: Not on file   Highest education level: Not on file  Occupational History   Not on file  Tobacco Use   Smoking status: Never   Smokeless tobacco: Never  Vaping Use   Vaping status: Never Used  Substance and Sexual Activity   Alcohol use: Yes    Comment: 1 week   Drug use: No   Sexual activity: Yes    Birth control/protection: Pill  Other Topics Concern   Not on file  Social History Narrative   Not on file   Social Drivers of Health   Financial Resource Strain: Not on file  Food Insecurity: Not on file  Transportation Needs: Not on file  Physical Activity: Not on file  Stress: Not on file  Social Connections: Not on file  Intimate Partner Violence: Not on  file     Current Outpatient Medications:    busPIRone  (BUSPAR ) 5 MG tablet, Take 1 tab QD to BID prn sx, Disp: 120 tablet, Rfl: 3   NIKKI  3-0.02 MG tablet, Take 1 tablet by mouth daily., Disp: 84 tablet, Rfl: 3   tamsulosin  (FLOMAX ) 0.4 MG CAPS capsule, Take 1 capsule (0.4 mg total) by mouth daily. (Patient not taking: Reported on 06/11/2023), Disp: 30 capsule, Rfl: 3     ROS:  Review of Systems  Constitutional:  Negative for fatigue, fever and unexpected weight change.  Respiratory:  Negative for cough, shortness of breath and wheezing.    Cardiovascular:  Negative for chest pain, palpitations and leg swelling.  Gastrointestinal:  Negative for blood in stool, constipation, diarrhea, nausea and vomiting.  Endocrine: Negative for cold intolerance, heat intolerance and polyuria.  Genitourinary:  Negative for dyspareunia, dysuria, flank pain, frequency, genital sores, hematuria, menstrual problem, pelvic pain, urgency, vaginal bleeding, vaginal discharge and vaginal pain.  Musculoskeletal:  Negative for back pain, joint swelling and myalgias.  Skin:  Positive for rash.  Neurological:  Negative for dizziness, syncope, light-headedness, numbness and headaches.  Hematological:  Negative for adenopathy.  Psychiatric/Behavioral:  Positive for agitation. Negative for confusion, sleep disturbance and suicidal ideas. The patient is not nervous/anxious.    BREAST: No symptoms   Objective: BP 107/73   Pulse (!) 101   Ht 5' 4.5" (1.638 m)   Wt 185 lb (83.9 kg)   LMP 05/27/2023 (Approximate)   BMI 31.26 kg/m    Physical Exam Constitutional:      Appearance: She is well-developed.  Genitourinary:     Vulva normal.     Right Labia: No rash, tenderness or lesions.    Left Labia: No tenderness, lesions or rash.    No vaginal discharge, erythema or tenderness.      Right Adnexa: not tender and no mass present.    Left Adnexa: not tender and no mass present.    No cervical friability or polyp.     Uterus is not enlarged or tender.  Breasts:    Right: No mass, nipple discharge, skin change or tenderness.     Left: No mass, nipple discharge, skin change or tenderness.  Neck:     Thyroid : No thyromegaly.  Cardiovascular:     Rate and Rhythm: Normal rate and regular rhythm.     Heart sounds: Normal heart sounds. No murmur heard. Pulmonary:     Effort: Pulmonary effort is normal.     Breath sounds: Normal breath sounds.  Abdominal:     Palpations: Abdomen is soft.     Tenderness: There is no abdominal tenderness. There is no  guarding or rebound.  Musculoskeletal:        General: Normal range of motion.     Cervical back: Normal range of motion.  Lymphadenopathy:     Cervical: No cervical adenopathy.  Neurological:     General: No focal deficit present.     Mental Status: She is alert and oriented to person, place, and time.     Cranial Nerves: No cranial nerve deficit.  Skin:    General: Skin is warm and dry.  Psychiatric:        Mood and Affect: Mood normal.        Behavior: Behavior normal.        Thought Content: Thought content normal.        Judgment: Judgment normal.  Vitals reviewed.     Assessment/Plan: Encounter for  annual routine gynecological examination  Screening for STD (sexually transmitted disease) - Plan: Cervicovaginal ancillary only  Encounter for birth control pills maintenance - Plan: NIKKI  3-0.02 MG tablet; OCP RF eRxd. Doing well.   GAD (generalized anxiety disorder) - Plan: busPIRone  (BUSPAR ) 5 MG tablet; Rx RF eRxd, f/u prn.   Family history of ovarian cancer--pt is MyRisk neg, no further screening indicated  Monoallelic mutation of MUTYH gene-- no FH colon cancer; no increased screening recommendations currently.    Meds ordered this encounter  Medications   busPIRone  (BUSPAR ) 5 MG tablet    Sig: Take 1 tab QD to BID prn sx    Dispense:  120 tablet    Refill:  3    Supervising Provider:   ROBY, MICIA [[id number]]   NIKKI  3-0.02 MG tablet    Sig: Take 1 tablet by mouth daily.    Dispense:  84 tablet    Refill:  3    Supervising Provider:   ROBY, MICIA [[id number]]             GYN counsel adequate intake of calcium and vitamin D, diet and exercise     F/U  Return in about 1 year (around 06/10/2024).  Daelyn Pettaway B. Albirta Rhinehart, PA-C 06/11/2023 5:20 PM

## 2023-06-11 ENCOUNTER — Other Ambulatory Visit (HOSPITAL_COMMUNITY)
Admission: RE | Admit: 2023-06-11 | Discharge: 2023-06-11 | Disposition: A | Discharge status: Home / Self Care | Source: Ambulatory Visit | Attending: Obstetrics and Gynecology | Admitting: Obstetrics and Gynecology

## 2023-06-11 ENCOUNTER — Ambulatory Visit (INDEPENDENT_AMBULATORY_CARE_PROVIDER_SITE_OTHER): Payer: Self-pay | Admitting: Obstetrics and Gynecology

## 2023-06-11 ENCOUNTER — Encounter: Payer: Self-pay | Admitting: Obstetrics and Gynecology

## 2023-06-11 VITALS — BP 107/73 | HR 101 | Ht 64.5 in | Wt 185.0 lb

## 2023-06-11 DIAGNOSIS — Z01419 Encounter for gynecological examination (general) (routine) without abnormal findings: Secondary | ICD-10-CM

## 2023-06-11 DIAGNOSIS — Z113 Encounter for screening for infections with a predominantly sexual mode of transmission: Secondary | ICD-10-CM | POA: Diagnosis present

## 2023-06-11 DIAGNOSIS — F411 Generalized anxiety disorder: Secondary | ICD-10-CM

## 2023-06-11 DIAGNOSIS — Z3041 Encounter for surveillance of contraceptive pills: Secondary | ICD-10-CM

## 2023-06-11 DIAGNOSIS — Z1589 Genetic susceptibility to other disease: Secondary | ICD-10-CM | POA: Insufficient documentation

## 2023-06-11 DIAGNOSIS — Z8041 Family history of malignant neoplasm of ovary: Secondary | ICD-10-CM

## 2023-06-11 MED ORDER — BUSPIRONE HCL 5 MG PO TABS: ORAL_TABLET | ORAL | 3 refills | Status: AC

## 2023-06-11 MED ORDER — NIKKI 3-0.02 MG PO TABS: 1.0000 | ORAL_TABLET | Freq: Every day | ORAL | 3 refills | Status: DC

## 2023-06-11 NOTE — Patient Instructions (Signed)
 I value your feedback and you entrusting [place] with your care. If you get a [place] patient survey, I would appreciate you taking the time to let us know about your experience today. Thank you! ? ? ?

## 2023-06-13 LAB — CERVICOVAGINAL ANCILLARY ONLY
Chlamydia: NEGATIVE
Comment: NEGATIVE
Comment: NORMAL
Neisseria Gonorrhea: NEGATIVE

## 2023-07-19 ENCOUNTER — Other Ambulatory Visit: Payer: Self-pay | Admitting: Obstetrics and Gynecology

## 2023-07-19 DIAGNOSIS — Z3041 Encounter for surveillance of contraceptive pills: Secondary | ICD-10-CM

## 2023-11-18 ENCOUNTER — Encounter: Payer: Self-pay | Admitting: Obstetrics and Gynecology
